# Patient Record
Sex: Female | Born: 1935 | Race: Black or African American | Hispanic: No | Smoking: Current some day smoker
Health system: Southern US, Community
[De-identification: ages and names within clinical notes are randomized; demographics above are authoritative.]

## PROBLEM LIST (undated history)

## (undated) DIAGNOSIS — F329 Major depressive disorder, single episode, unspecified: Secondary | ICD-10-CM

## (undated) DIAGNOSIS — N189 Chronic kidney disease, unspecified: Secondary | ICD-10-CM

## (undated) DIAGNOSIS — K5790 Diverticulosis of intestine, part unspecified, without perforation or abscess without bleeding: Secondary | ICD-10-CM

## (undated) DIAGNOSIS — I209 Angina pectoris, unspecified: Secondary | ICD-10-CM

## (undated) DIAGNOSIS — F32A Depression, unspecified: Secondary | ICD-10-CM

## (undated) DIAGNOSIS — M1990 Unspecified osteoarthritis, unspecified site: Secondary | ICD-10-CM

## (undated) DIAGNOSIS — J449 Chronic obstructive pulmonary disease, unspecified: Secondary | ICD-10-CM

## (undated) DIAGNOSIS — I739 Peripheral vascular disease, unspecified: Secondary | ICD-10-CM

## (undated) DIAGNOSIS — N200 Calculus of kidney: Secondary | ICD-10-CM

## (undated) DIAGNOSIS — I10 Essential (primary) hypertension: Secondary | ICD-10-CM

## (undated) DIAGNOSIS — I4891 Unspecified atrial fibrillation: Secondary | ICD-10-CM

## (undated) DIAGNOSIS — M109 Gout, unspecified: Secondary | ICD-10-CM

## (undated) DIAGNOSIS — E785 Hyperlipidemia, unspecified: Secondary | ICD-10-CM

## (undated) DIAGNOSIS — C801 Malignant (primary) neoplasm, unspecified: Secondary | ICD-10-CM

## (undated) DIAGNOSIS — I2510 Atherosclerotic heart disease of native coronary artery without angina pectoris: Secondary | ICD-10-CM

## (undated) DIAGNOSIS — I639 Cerebral infarction, unspecified: Secondary | ICD-10-CM

## (undated) DIAGNOSIS — I219 Acute myocardial infarction, unspecified: Secondary | ICD-10-CM

## (undated) DIAGNOSIS — H9190 Unspecified hearing loss, unspecified ear: Secondary | ICD-10-CM

## (undated) HISTORY — PX: BREAST SURGERY: SHX581

## (undated) HISTORY — PX: KIDNEY STONE SURGERY: SHX686

## (undated) HISTORY — PX: APPENDECTOMY: SHX54

## (undated) HISTORY — PX: CORONARY ARTERY BYPASS GRAFT: SHX141

## (undated) HISTORY — PX: MASTECTOMY: SHX3

## (undated) HISTORY — PX: CARDIAC SURGERY: SHX584

---

## 2003-03-12 ENCOUNTER — Other Ambulatory Visit: Payer: Self-pay

## 2007-06-29 ENCOUNTER — Ambulatory Visit: Payer: Self-pay | Admitting: Unknown Physician Specialty

## 2009-06-15 ENCOUNTER — Inpatient Hospital Stay: Payer: Self-pay | Admitting: Internal Medicine

## 2010-02-12 ENCOUNTER — Inpatient Hospital Stay: Payer: Self-pay | Admitting: Internal Medicine

## 2010-08-06 ENCOUNTER — Inpatient Hospital Stay: Payer: Self-pay | Admitting: Internal Medicine

## 2010-09-24 ENCOUNTER — Inpatient Hospital Stay: Payer: Self-pay | Admitting: Internal Medicine

## 2011-06-22 ENCOUNTER — Inpatient Hospital Stay: Payer: Self-pay | Admitting: Student

## 2011-06-22 LAB — URINALYSIS, COMPLETE
Bilirubin,UR: NEGATIVE
Blood: NEGATIVE
Glucose,UR: NEGATIVE mg/dL (ref 0–75)
Nitrite: NEGATIVE
Ph: 5 (ref 4.5–8.0)
Protein: 100
Specific Gravity: 1.015 (ref 1.003–1.030)
Squamous Epithelial: 12

## 2011-06-22 LAB — COMPREHENSIVE METABOLIC PANEL
Albumin: 3.5 g/dL (ref 3.4–5.0)
Alkaline Phosphatase: 74 U/L (ref 50–136)
BUN: 34 mg/dL — ABNORMAL HIGH (ref 7–18)
Bilirubin,Total: 0.2 mg/dL (ref 0.2–1.0)
Co2: 23 mmol/L (ref 21–32)
Creatinine: 2.14 mg/dL — ABNORMAL HIGH (ref 0.60–1.30)
EGFR (Non-African Amer.): 24 — ABNORMAL LOW
Glucose: 151 mg/dL — ABNORMAL HIGH (ref 65–99)
Osmolality: 292 (ref 275–301)
SGPT (ALT): 26 U/L
Sodium: 141 mmol/L (ref 136–145)
Total Protein: 7.2 g/dL (ref 6.4–8.2)

## 2011-06-22 LAB — CBC
HGB: 8 g/dL — ABNORMAL LOW (ref 12.0–16.0)
MCHC: 33.8 g/dL (ref 32.0–36.0)
Platelet: 221 10*3/uL (ref 150–440)
RDW: 15.9 % — ABNORMAL HIGH (ref 11.5–14.5)
WBC: 10.4 10*3/uL (ref 3.6–11.0)

## 2011-06-23 LAB — PROTIME-INR
INR: 0.9
Prothrombin Time: 12.8 secs (ref 11.5–14.7)

## 2011-06-23 LAB — CBC WITH DIFFERENTIAL/PLATELET
Basophil #: 0 10*3/uL (ref 0.0–0.1)
Basophil #: 0 10*3/uL (ref 0.0–0.1)
Basophil %: 0.4 %
Eosinophil #: 0.1 10*3/uL (ref 0.0–0.7)
Eosinophil #: 0.1 10*3/uL (ref 0.0–0.7)
Eosinophil %: 1 %
HCT: 20.2 % — ABNORMAL LOW (ref 35.0–47.0)
HCT: 27.5 % — ABNORMAL LOW (ref 35.0–47.0)
Lymphocyte #: 2.1 10*3/uL (ref 1.0–3.6)
Lymphocyte #: 1.6 10*3/uL (ref 1.0–3.6)
Lymphocyte %: 21.9 %
MCH: 31.7 pg (ref 26.0–34.0)
MCH: 31.5 pg (ref 26.0–34.0)
MCHC: 34.3 g/dL (ref 32.0–36.0)
MCV: 95 fL (ref 80–100)
Monocyte #: 0.7 10*3/uL (ref 0.0–0.7)
Monocyte #: 0.6 10*3/uL (ref 0.0–0.7)
Monocyte %: 8.3 %
Monocyte %: 8.2 %
Neutrophil #: 5.7 10*3/uL (ref 1.4–6.5)
Neutrophil #: 4.9 10*3/uL (ref 1.4–6.5)
Platelet: 186 10*3/uL (ref 150–440)
Platelet: 175 10*3/uL (ref 150–440)
RBC: 2.13 10*6/uL — ABNORMAL LOW (ref 3.80–5.20)
RDW: 15.8 % — ABNORMAL HIGH (ref 11.5–14.5)
RDW: 17.2 % — ABNORMAL HIGH (ref 11.5–14.5)
WBC: 8.6 10*3/uL (ref 3.6–11.0)
WBC: 7.2 10*3/uL (ref 3.6–11.0)

## 2011-06-23 LAB — BASIC METABOLIC PANEL
Anion Gap: 10 (ref 7–16)
Calcium, Total: 8.1 mg/dL — ABNORMAL LOW (ref 8.5–10.1)
Chloride: 111 mmol/L — ABNORMAL HIGH (ref 98–107)
Co2: 22 mmol/L (ref 21–32)
EGFR (African American): 35 — ABNORMAL LOW
Osmolality: 290 (ref 275–301)
Potassium: 4.2 mmol/L (ref 3.5–5.1)
Sodium: 143 mmol/L (ref 136–145)

## 2011-06-24 LAB — BASIC METABOLIC PANEL
Anion Gap: 12 (ref 7–16)
BUN: 17 mg/dL (ref 7–18)
Calcium, Total: 8.5 mg/dL (ref 8.5–10.1)
Creatinine: 1.57 mg/dL — ABNORMAL HIGH (ref 0.60–1.30)
EGFR (African American): 41 — ABNORMAL LOW
Glucose: 89 mg/dL (ref 65–99)
Osmolality: 284 (ref 275–301)
Sodium: 142 mmol/L (ref 136–145)

## 2011-06-24 LAB — CBC WITH DIFFERENTIAL/PLATELET
Basophil %: 0.4 %
Eosinophil %: 1.7 %
HCT: 27.2 % — ABNORMAL LOW (ref 35.0–47.0)
HGB: 9.3 g/dL — ABNORMAL LOW (ref 12.0–16.0)
Lymphocyte #: 1.7 10*3/uL (ref 1.0–3.6)
MCH: 31.3 pg (ref 26.0–34.0)
MCV: 92 fL (ref 80–100)
Monocyte #: 0.6 10*3/uL (ref 0.0–0.7)
Neutrophil #: 4.6 10*3/uL (ref 1.4–6.5)
Platelet: 179 10*3/uL (ref 150–440)
RBC: 2.97 10*6/uL — ABNORMAL LOW (ref 3.80–5.20)
WBC: 7.1 10*3/uL (ref 3.6–11.0)

## 2011-06-25 LAB — CBC WITH DIFFERENTIAL/PLATELET
Basophil #: 0 10*3/uL (ref 0.0–0.1)
Basophil %: 0.3 %
Eosinophil #: 0.1 10*3/uL (ref 0.0–0.7)
HCT: 26.1 % — ABNORMAL LOW (ref 35.0–47.0)
HGB: 8.8 g/dL — ABNORMAL LOW (ref 12.0–16.0)
Lymphocyte #: 1.2 10*3/uL (ref 1.0–3.6)
Monocyte #: 0.6 10*3/uL (ref 0.0–0.7)
Neutrophil #: 4.6 10*3/uL (ref 1.4–6.5)
Platelet: 182 10*3/uL (ref 150–440)
RBC: 2.81 10*6/uL — ABNORMAL LOW (ref 3.80–5.20)
RDW: 17.2 % — ABNORMAL HIGH (ref 11.5–14.5)

## 2011-06-25 LAB — BASIC METABOLIC PANEL
BUN: 19 mg/dL — ABNORMAL HIGH (ref 7–18)
Calcium, Total: 8.7 mg/dL (ref 8.5–10.1)
Creatinine: 1.82 mg/dL — ABNORMAL HIGH (ref 0.60–1.30)
EGFR (African American): 35 — ABNORMAL LOW
EGFR (Non-African Amer.): 29 — ABNORMAL LOW
Potassium: 4 mmol/L (ref 3.5–5.1)

## 2013-01-30 ENCOUNTER — Other Ambulatory Visit: Payer: Self-pay | Admitting: Rheumatology

## 2013-02-03 LAB — WOUND AEROBIC CULTURE

## 2013-03-09 ENCOUNTER — Ambulatory Visit: Payer: Self-pay | Admitting: Emergency Medicine

## 2014-03-16 ENCOUNTER — Emergency Department: Payer: Self-pay | Admitting: Emergency Medicine

## 2014-03-16 LAB — CBC WITH DIFFERENTIAL/PLATELET
Basophil #: 0.1 10*3/uL (ref 0.0–0.1)
Basophil %: 0.6 %
EOS ABS: 0.1 10*3/uL (ref 0.0–0.7)
EOS PCT: 0.8 %
HCT: 37.2 % (ref 35.0–47.0)
HGB: 11.8 g/dL — AB (ref 12.0–16.0)
LYMPHS ABS: 1.6 10*3/uL (ref 1.0–3.6)
Lymphocyte %: 17.8 %
MCH: 31.3 pg (ref 26.0–34.0)
MCHC: 31.7 g/dL — AB (ref 32.0–36.0)
MCV: 99 fL (ref 80–100)
Monocyte #: 0.8 x10 3/mm (ref 0.2–0.9)
Monocyte %: 8.7 %
NEUTROS ABS: 6.4 10*3/uL (ref 1.4–6.5)
Neutrophil %: 72.1 %
PLATELETS: 290 10*3/uL (ref 150–440)
RBC: 3.77 10*6/uL — ABNORMAL LOW (ref 3.80–5.20)
RDW: 15.5 % — ABNORMAL HIGH (ref 11.5–14.5)
WBC: 8.8 10*3/uL (ref 3.6–11.0)

## 2014-03-16 LAB — COMPREHENSIVE METABOLIC PANEL
AST: 17 U/L (ref 15–37)
Albumin: 3.5 g/dL (ref 3.4–5.0)
Alkaline Phosphatase: 73 U/L
Anion Gap: 8 (ref 7–16)
BUN: 37 mg/dL — AB (ref 7–18)
Bilirubin,Total: 0.4 mg/dL (ref 0.2–1.0)
CREATININE: 2.41 mg/dL — AB (ref 0.60–1.30)
Calcium, Total: 8.7 mg/dL (ref 8.5–10.1)
Chloride: 105 mmol/L (ref 98–107)
Co2: 22 mmol/L (ref 21–32)
Glucose: 97 mg/dL (ref 65–99)
OSMOLALITY: 279 (ref 275–301)
Potassium: 4.5 mmol/L (ref 3.5–5.1)
SGPT (ALT): 22 U/L
Sodium: 135 mmol/L — ABNORMAL LOW (ref 136–145)
Total Protein: 7.6 g/dL (ref 6.4–8.2)

## 2014-03-16 LAB — LIPASE, BLOOD: Lipase: 242 U/L (ref 73–393)

## 2014-08-26 NOTE — Consult Note (Signed)
PATIENT NAME:  Kathryn Cain, Kathryn Cain MR#:  [id number] DATE OF BIRTH:  Mar 05, 1934  DATE OF CONSULTATION:  06/23/2011  REFERRING PHYSICIAN:  Max Sane, MD  CONSULTING PHYSICIAN:  Jill Side, MD  REASON FOR CONSULTATION: Hematochezia and anemia.   HISTORY OF PRESENT ILLNESS: This is a 81 year old female with history of diverticulosis and coronary artery disease. The patient recently restarted taking her aspirin 81 mg a day. Three days ago she went to the bathroom and had an episode of bright red blood per rectum. After that for the next 24 hours she had several bloody bowel movements. The last bloody bowel movement was about 48 hours ago. She went to her physician where her hemoglobin was noted to be 8. She came to the Emergency Room last night where hemoglobin was 8 and she was heme negative from below. This morning her hemoglobin has dropped to 6.9 and she is currently receiving packed RBC transfusion. Denies any further active bleeding. The patient has history of prior lower GI bleeding that was thought to be secondary to diverticulosis related to aspirin use. Denies any nausea, vomiting, hematemesis, or abdominal pain.   PAST MEDICAL HISTORY:  1. Hyperlipidemia. 2. Hypertension. 3. Coronary artery disease, status post CABG. 4. History of diverticulosis and diverticular bleeding. 5. Chronic obstructive pulmonary disease. 6. Chronic renal failure.  7. History of breast cancer.   PAST SURGICAL HISTORY:  1. Right-sided mastectomy.  2. Appendectomy.  3. Bowel surgery.   ALLERGIES: Morphine and Cipro. She is intolerant of aspirin.   FAMILY HISTORY: Positive for coronary artery disease.   SOCIAL HISTORY: She smokes about five cigarettes a day.   MEDICATIONS AT HOME:  1. Pravachol 20 mg a day. 2. Amiodarone 200 mg a day. 3. Gemfibrozil 600 mg b.i.d.  4. Nitroglycerin p.r.n.  5. Atenolol 25 mg a day. 6. Metoprolol 25 mg b.i.d.   REVIEW OF SYSTEMS: Quite unremarkable except for what  is mentioned in the history of present illness. She denies any chest pain, orthopnea, PND, or dyspnea on exertion.   PHYSICAL EXAMINATION:   GENERAL: Somewhat obese female. She does not appear to be in any acute distress. Quite awake and alert.   HEENT: Grossly unremarkable.   VITAL SIGNS: Temperature 98.6, pulse 78, respirations 18, blood pressure 134/78.   LUNGS: Grossly clear to auscultation.   CARDIOVASCULAR: Regular rate and rhythm. No gallops or murmur.   ABDOMEN: Slightly distended but soft. Bowel sounds positive. Nontender. No rebound or guarding was noted.   NEUROLOGIC: Appears to be unremarkable.   LABORATORY, DIAGNOSTIC, AND RADIOLOGICAL DATA: White cell count was 10.4 yesterday, is 8.6 today. Hemoglobin was 8 yesterday, 6.8 today. Platelet count 186. MCV normal at 95. Electrolytes unremarkable as well as liver enzymes. Creatinine 2.14. BUN 34. PT and INR are within normal limits.   ASSESSMENT AND PLAN: The patient is with hematochezia most likely secondary to diverticulosis. The patient has developed significant anemia with a hemoglobin of only 6.8. The patient has history of breast cancer. There is a possibility of chronic neoplasm, although the pattern of bleeding is more consistent with diverticular bleed. The patient had a colonoscopy about four years ago and no malignancy was found, although multiple polyps were removed. Agree with packed RBC transfusion and clear liquid diet. No need for Zosyn as there is no evidence of acute diverticulitis.   Plan has been discussed with the patient. Options were discussed including a repeat colonoscopy while she is in the hospital versus conservative management for suspected diverticular  bleed and colonoscopy as an outpatient at a later date. She has opted to undergo colonoscopy while she is in the hospital. I agree as this appears to be a good window of opportunity to perform a colonoscopy which would give [place] valuable information in case of  rebleeding as patient has already had at least one episode of diverticular bleed in the past. The procedure has been discussed with the patient in detail. She is in full agreement. We will proceed with a colonoscopy in a.m. Avoid aspirin in the future.   Further recommendations to follow.   ____________________________ Jill Side, MD si:drc D: 06/23/2011 11:19:38 ET T: 06/23/2011 11:34:12 ET JOB#: [id number]  cc: Jill Side, MD, <Dictator> Jill Side MD ELECTRONICALLY SIGNED 06/23/2011 12:06

## 2014-08-26 NOTE — H&P (Signed)
PATIENT NAME:  Kathryn Cain, Kathryn Cain MR#:  [id number] DATE OF BIRTH:  Aug 08, 1933  DATE OF ADMISSION:  06/22/2011  PRIMARY CARE PHYSICIAN: Dr. Jimmye Norman at [facility]   REQUESTING PHYSICIAN: Dr. Lisa Roca    CHIEF COMPLAINT: Abdominal pain and rectal bleed.   HISTORY OF PRESENT ILLNESS: The patient is a 81 year old female with a known history of diverticulitis, colitis, and coronary disease who is being admitted for anemia and possible lower GI bleed. The patient took a baby aspirin last Monday followed by Thursday as per request from Dr. Clayborn Bigness as per the patient when she saw him in January. The patient also ate some peanuts over the last week and started having blood which was noticeable for the last two days. She went to see her primary care physician who noticed her to be anemic with hemoglobin of 8 and requested her to come to the Emergency Department. While in the ED, her hemoglobin is still 8 and she is being admitted for further evaluation and management. Her Hemoccult stool was negative while in the ED as per the ED physician.   PAST MEDICAL HISTORY:  1. Hyperlipidemia.  2. Hypertension.  3. Coronary artery disease, status post CABG.  4. Diverticular bleed.  5. Colitis.  6. Chronic obstructive pulmonary disease.  7. 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  8. History of breast cancer.    PAST SURGICAL HISTORY:  1. Right-sided mastectomy.  2. Appendectomy.  3. Obstructive bowel surgery.   ALLERGIES: Morphine, aspirin, and Cipro.   FAMILY HISTORY: Positive for coronary disease.   SOCIAL HISTORY: She smokes 4 to 5 cigarettes for a long time. Denies any alcohol or drug use.   MEDICATIONS AT HOME BASED ON LAST DISCHARGE SUMMARY AND WHAT SHE COULD CONFIRM:  1. Pravachol 20 mg p.o. daily.  2. Amiodarone 200 mg p.o. daily. 3. Gemfibrozil 600 mg p.o. b.i.d.  4. Nitroglycerin 0.4 mg sublingual 1 tablet daily as needed.  5. Atenolol 25 mg p.o. daily.  6. Metoprolol 25 mg p.o. b.i.d.   REVIEW OF  SYSTEMS: CONSTITUTIONAL: No fever, fatigue, weakness. EYES: No blurred or double vision. ENT: No tinnitus or ear pain. RESPIRATORY: No cough, wheezing, hemoptysis. CARDIOVASCULAR: No chest pain, orthopnea, edema. GI: Positive for rectal bleed. No abdominal pain, nausea, or vomiting. GU: No dysuria or hematuria. ENDOCRINE: No polyuria or nocturia. HEMATOLOGY: Positive for anemia. No easy bruising. Positive for GI bleed. SKIN: No obvious rash, lesion, ulcer. NEUROLOGIC: No tingling, numbness, weakness. PSYCHIATRY: No history of anxiety or depression.   PHYSICAL EXAMINATION:   VITAL SIGNS: Temperature 98.4, heart rate 100, respirations 20 per minute, blood pressure 147/75 mmHg. She is saturating 99% on room air.   GENERAL: The patient is a 81 year old female lying in the bed comfortably without any acute distress.   EYES: Pupils equal, round, reactive to light and accommodation. No scleral icterus. Extraocular muscles intact.   HEENT: Head atraumatic, normocephalic. Oropharynx and nasopharynx clear.   NECK: Supple. No jugular venous distention. No thyroid enlargement or tenderness.   LUNGS: Clear to auscultation bilaterally. No wheezing, rales, rhonchi, or crepitation.   CARDIOVASCULAR: S1, S2 normal, tachycardic. No murmur, rubs, or gallop.   ABDOMEN: Soft, nontender, nondistended. Bowel sounds present. No organomegaly or mass.   EXTREMITIES: No pedal edema, cyanosis, clubbing.   NEUROLOGIC: Nonfocal examination. Cranial nerves III to XII intact. Muscle strength 5 out of 5 in all extremities. Sensation intact.   PSYCHIATRIC: The patient is oriented to time, place, and person x3.   SKIN:  No obvious rash, lesion, or ulcer.  LABORATORY, DIAGNOSTIC, AND RADIOLOGICAL DATA: Normal BMP except BUN of 34, creatinine 2.14, blood glucose 151. Normal liver function tests. Normal CBC except hemoglobin of 8, hematocrit 23.6. Urinalysis is negative except trace bacteria and 1 WBC.   IMPRESSION AND PLAN:   1. Anemia, likely acute on chronic blood loss, possibly diverticular in nature. Will start on IV Zosyn for the time being (per d/w dr Tiffany Kocher as he feels this could be due to underlying diverticulitis). Consult GI. Type and cross two units of packed red blood cells. She will likely need transfusion if she drops hemoglobin less than 8 considering her underlying coronary artery disease.  2. Possible GI bleed, likely diverticular in nature. Will consult GI. Start her on IV Protonix twice a day. She may need colonoscopy.  3. Acute on chronic kidney disease, stage II to III, with a baseline creatinine of 1.3 to 1.5. This is likely prerenal in nature. Will hydrate her and monitor her renal function. Avoid any nephrotoxins.  4. Hypertension. Will continue home medication.  5. Sinus tachycardia likely due to her not getting her medications this morning. Will start her on atenolol and metoprolol which should help her rate control.  6. Coronary artery disease, status post CABG. Will continue atenolol and metoprolol at this time. Doubt can use any antiplatelet agent as she does not tolerate. This is the second time she bled the same way after aspirin.   7. Tobacco abuse. She was counseled for about three minutes. She denies any need of nicotine replacement therapy at this time.      TOTAL TIME TAKING CARE OF THIS PATIENT: 55 minutes.   ____________________________ Kathryn Mellow. Manuella Ghazi, MD vss:drc D: 06/22/2011 20:43:39 ET T: 06/23/2011 06:24:51 ET JOB#: [id number]  cc: Kaegan Stigler S. Manuella Ghazi, MD, <Dictator> [place] MD ELECTRONICALLY SIGNED 06/23/2011 10:55

## 2014-08-26 NOTE — Consult Note (Signed)
Chief Complaint:   Subjective/Chief Complaint Colonoscopy prep. was poor with solid stools throughout the colon. Left sided diverticulosis without active bleeding. H and H stable.  Recommendations: Soft diet. Watch overnight. Follow H and H. Will follow.   Electronic Signatures: Jill Side (MD)  (Signed [phone] 12:45)  Authored: Chief Complaint   Last Updated: 20-Feb-13 12:45 by Jill Side (MD)

## 2014-08-26 NOTE — Consult Note (Signed)
Pt to have consult with Dr. Dionne Milo who is on call today. I talked to him.  Electronic Signatures: Manya Silvas (MD)  (Signed on 19-Feb-13 07:39)  Authored  Last Updated: 19-Feb-13 07:39 by Manya Silvas (MD)

## 2014-08-26 NOTE — Discharge Summary (Signed)
PATIENT NAME:  Kathryn Cain, Kathryn Cain MR#:  [id number] DATE OF BIRTH:  Mar 28, 1934  DATE OF ADMISSION:  06/22/2011 DATE OF DISCHARGE:  06/25/2011  CHIEF COMPLAINT: Abdominal pain and rectal bleeding.   CONSULTANT: Jill Side, MD - Gastroenterology.   DISCHARGE DIAGNOSES:  1. Acute on chronic anemia, likely from acute diverticular bleed. 2. Tachycardia.  SECONDARY DIAGNOSES:  1. Hyperlipidemia. 2. Hypertension. 3. Coronary artery disease status post coronary artery bypass graft.  4. History of diverticular bleed. 5. History of colitis. 6. Chronic kidney disease.  7. Chronic obstructive pulmonary disease.  8. History of breast cancer.   DISCHARGE MEDICATIONS:  1. Amiodarone 200 mg daily.  2. Gemfibrozil 600 mg twice a day. 3. Nitroglycerin 0.4 mg sublingual as needed. 4. Hydrocortisone suppository 25 mg per rectum twice a day. 5. Metoprolol tartrate 50 mg twice a day. 6. Pravastatin 80 mg daily.  7. Lasix 20 mg daily.  8. Lovaza 1000 mg 2 tabs twice a day. 9. Amlodipine 10 mg daily.   DIET: Low sodium, ADA, soft diet and after two weeks high fiber diet.   ACTIVITY: As tolerated.   DISCHARGE FOLLOWUP: Please follow-up with her primary care physician tomorrow with a BMP and full complete blood count check as discussed   HISTORY OF PRESENT ILLNESS: Please see the full history and physical dictated on 06/22/2011 by Dr. Manuella Ghazi, but briefly this is a 81 year old female with history of diverticulosis, diverticular bleed, colitis, and coronary artery disease status post CABG who presented in the setting of abdominal pain and rectal bleeding. The patient has been on aspirin at the request of her cardiologist. The patient also ate some peanuts.  She had some lower bleed and was noted to have anemia with hemoglobin of 8 and was admitted to the hospitalist service for further evaluation and management.   SIGNIFICANT LABS/IMAGING: Creatinine on arrival 2.14 and on discharge 1.82. Initial  hemoglobin 8 and lowest hemoglobin was on 06/23/2011 being 6.8 and on discharge 8.8. Hematocrit on arrival 23.6 and on discharge 26.1. Platelets 221. INR 0.9.   HOSPITAL COURSE:  1. Anemia: This was likely acute on chronic anemia in the setting of GI losses. Gastroenterology was consulted. She was initially started on a PPI. She has been on aspirin, however, she has a history of diverticulosis and diverticular bleed in the past. She was initially put on clears and per Gastroenterology underwent a colonoscopy. The patient did require 2 units of PRBC. The patient was initially admitted with Zosyn, however, Zosyn was stopped as I doubt that this was colitis as the patient had no fever or leukocytosis to suggest ongoing infectious process. This was also recommended per Gastroenterology. Unfortunately the colonoscopy had a bad prep and stool was seen throughout the colon. Besides diverticulosis, no acute bleed was found. The patient's blood count initially went up today and has trended down, however, the patient has not had any further bleeding and has no abdominal pain. The patient is adamant about going home. The risks of going home with down trend in blood count was discussed with the patient including ongoing bleed, perfuse bleeding, myocardial infarction, and even death. The patient states that she has some personal matters to take care of, however, she will go to her PCP tomorrow and check her blood counts and, if they are low or if she has ongoing bleed, she will come back to the ED. At this point, she has no symptoms of chest pain, shortness of breath, dizziness, palpitations, or abdominal pain. She will  be discharged with follow-up with her PCP tomorrow.  2. Acute on chronic renal failure: The patient does have stage II to III, with baseline creatinine to mid ones. On arrival it was slightly higher than her baseline. This was likely in the setting of GI bleed. She was given some IV fluids on arrival and then  did receive blood products. This has stabilized and it could be followed up as an outpatient.  3. Coronary artery disease, status post CABG: The patient has been on a beta blocker. There is some confusion about her beta blocker use. She had stated that she was on atenolol and metoprolol; however, this needs to be corroborated. That was started here, however. On discharge we will stop atenolol and discharge her on the metoprolol. She is also on aspirin, however, given the second bleed recently in the last year or so, this has to be discussed with the cardiologist and PCP in terms of the risk/benefit ratio. We will not discharge her with the aspirin. However, we would continue the beta blocker as the statin.  4. Hypertension: Outpatient medications could be resumed on an outpatient basis.  5. Sinus tachycardia: The patient was tachycardic on arrival and this is likely in the setting of GI losses. Also she did not take her blood pressure medications, including the beta blockers, on arrival. Her beta blockers were resumed and the patient currently is asymptomatic with resolution of the tachycardia. At this point, given that she has no ongoing bleed and her promise to follow up with her PCP tomorrow, she will be discharged.   CODE STATUS: THE PATIENT IS FULL CODE.  TOTAL TIME SPENT: 40 minutes.  ____________________________ Vivien Presto, MD sa:slb D: 06/25/2011 13:48:16 ET T: 06/25/2011 14:38:37 ET JOB#: [id number]  cc: Vivien Presto, MD, <Dictator> Myrle Sheng. Jimmye Norman, MD Jill Side, MD Karel Jarvis [facility] MD ELECTRONICALLY SIGNED 07/04/2011 8:56

## 2014-08-26 NOTE — Consult Note (Signed)
Brief Consult Note: Diagnosis: Lower GI bleed with significant anemia.   Patient was seen by consultant.   Consult note dictated.   Comments: Lower GI bleed with significant anemia, most likely diverticular. No signs of active bleeding. History of colon polyps, last colonoscopy was in 2009 and polyps were removed.  Recommendations: Transfuse PRBC. DC Zosyn as no evidence of diverticulitis. Avoid ASA in the future. Will proceed with acolonoscopy in am as she is due for a repeat colonoscopy and also due to significant blood loss anemia. Discussed with her and she is in full agreement.  Electronic Signatures: Jill Side (MD)  (Signed 19-Feb-13 11:14)  Authored: Brief Consult Note   Last Updated: 19-Feb-13 11:14 by Jill Side (MD)

## 2015-05-08 DIAGNOSIS — R0602 Shortness of breath: Secondary | ICD-10-CM | POA: Diagnosis not present

## 2015-05-08 DIAGNOSIS — I480 Paroxysmal atrial fibrillation: Secondary | ICD-10-CM | POA: Diagnosis not present

## 2015-05-25 DIAGNOSIS — M5441 Lumbago with sciatica, right side: Secondary | ICD-10-CM | POA: Diagnosis not present

## 2015-06-05 DIAGNOSIS — I480 Paroxysmal atrial fibrillation: Secondary | ICD-10-CM | POA: Diagnosis not present

## 2015-06-05 DIAGNOSIS — G4700 Insomnia, unspecified: Secondary | ICD-10-CM | POA: Diagnosis not present

## 2015-06-05 DIAGNOSIS — E785 Hyperlipidemia, unspecified: Secondary | ICD-10-CM | POA: Diagnosis not present

## 2015-06-05 DIAGNOSIS — N184 Chronic kidney disease, stage 4 (severe): Secondary | ICD-10-CM | POA: Diagnosis not present

## 2015-06-05 DIAGNOSIS — I701 Atherosclerosis of renal artery: Secondary | ICD-10-CM | POA: Diagnosis not present

## 2015-06-05 DIAGNOSIS — I158 Other secondary hypertension: Secondary | ICD-10-CM | POA: Diagnosis not present

## 2015-06-05 DIAGNOSIS — R011 Cardiac murmur, unspecified: Secondary | ICD-10-CM | POA: Diagnosis not present

## 2015-06-07 DIAGNOSIS — C50112 Malignant neoplasm of central portion of left female breast: Secondary | ICD-10-CM | POA: Diagnosis not present

## 2015-06-07 DIAGNOSIS — Z4432 Encounter for fitting and adjustment of external left breast prosthesis: Secondary | ICD-10-CM | POA: Diagnosis not present

## 2015-07-08 DIAGNOSIS — J441 Chronic obstructive pulmonary disease with (acute) exacerbation: Secondary | ICD-10-CM | POA: Diagnosis not present

## 2015-07-08 DIAGNOSIS — I10 Essential (primary) hypertension: Secondary | ICD-10-CM | POA: Diagnosis not present

## 2015-07-08 DIAGNOSIS — M1990 Unspecified osteoarthritis, unspecified site: Secondary | ICD-10-CM | POA: Diagnosis not present

## 2015-09-04 ENCOUNTER — Ambulatory Visit
Admission: EM | Admit: 2015-09-04 | Discharge: 2015-09-04 | Disposition: A | Discharge status: Home / Self Care | Payer: PPO | Attending: Family Medicine | Admitting: Family Medicine

## 2015-09-04 ENCOUNTER — Encounter: Payer: Self-pay | Admitting: Emergency Medicine

## 2015-09-04 DIAGNOSIS — Z8739 Personal history of other diseases of the musculoskeletal system and connective tissue: Secondary | ICD-10-CM | POA: Diagnosis not present

## 2015-09-04 DIAGNOSIS — M79644 Pain in right finger(s): Secondary | ICD-10-CM

## 2015-09-04 HISTORY — DX: Essential (primary) hypertension: I10

## 2015-09-04 HISTORY — DX: Gout, unspecified: M10.9

## 2015-09-04 MED ORDER — PREDNISONE 10 MG (21) PO TBPK: ORAL_TABLET | ORAL | Status: DC

## 2015-09-04 MED ORDER — PREDNISONE 10 MG (21) PO TBPK
ORAL_TABLET | ORAL | Status: DC
Start: 2015-09-04 — End: 2015-09-04

## 2015-09-04 NOTE — ED Provider Notes (Signed)
CSN: [id number]     Arrival date & time 09/04/15  1430 History   First MD Initiated Contact with Patient 09/04/15 1511       Nurses notes were reviewed. Chief Complaint  Patient presents with  . finger pain     Patient Comes in because of pain in the right middle finger. She is with her husband. Neither are the best historians. At one time there indicating that his gout and then she changed her story that there was an infection present and after the pain she soak the finger in some Pine-[place]. After questioning them further she's had a history of gout in that finger before. They assure me that gout does look like this when he hits her finger. The fissure they did his gout. Consent discussed with him that if this is an infection prednisone is not necessarily the drug of choice was wishes with the requesting. They have informed me that she is on allopurinol which goes along with her history of having gout. M.D. also relate to me that further questioning she does have some issues with her kidneys that cannot say what exactly is the problem with her kidneys. Apparently because of renal failure she does does not use anti-inflammatories or colchicine for her gout flare. Explained to her that if her finger is not better in 48 hours she needs to go to the ED has a hand surgeon that she may need to have that finger opened and evaluated further by specialist in hand surgery.  (Consider location/radiation/quality/duration/timing/severity/associated sxs/prior Treatment) Patient is a 82 y.o. female presenting with extremity pain. The history is provided by the patient and the spouse. The history is limited by the condition of the patient. No language interpreter was used.  Extremity Pain This is a recurrent problem. The current episode started more than 2 days ago. The problem occurs constantly. The problem has been gradually worsening. Pertinent negatives include no chest pain, no abdominal pain, no headaches and  no shortness of breath. Exacerbated by: Use a finger or pressure on the finger. Nothing relieves the symptoms. She has tried nothing for the symptoms. The treatment provided no relief.    Past Medical History  Diagnosis Date  . Hypertension   . Gout    Past Surgical History  Procedure Laterality Date  . Breast surgery    . Cardiac surgery    . Appendectomy     History reviewed. No pertinent family history. Social History  Substance Use Topics  . Smoking status: Current Some Day Smoker    Types: Cigarettes  . Smokeless tobacco: None  . Alcohol Use: No   OB History    No data available     Review of Systems  Unable to perform ROS: Other  Respiratory: Negative for shortness of breath.   Cardiovascular: Negative for chest pain.  Gastrointestinal: Negative for abdominal pain.  Neurological: Negative for headaches.    Allergies  Codeine  Home Medications   Prior to Admission medications   Medication Sig Start Date End Date Taking? Authorizing Provider  ALLOPURINOL PO Take 100 mg by mouth daily.    Yes Historical Provider, MD  amiodarone (PACERONE) 200 MG tablet Take 200 mg by mouth daily.   Yes Historical Provider, MD  amLODipine (NORVASC) 10 MG tablet Take 10 mg by mouth daily.   Yes Historical Provider, MD  aspirin 81 MG tablet Take 81 mg by mouth daily.   Yes Historical Provider, MD  furosemide (LASIX) 40 MG tablet Take  40 mg by mouth daily.   Yes Historical Provider, MD  metoprolol tartrate (LOPRESSOR) 25 MG tablet Take 50 mg by mouth 2 (two) times daily.    Yes Historical Provider, MD  pravastatin (PRAVACHOL) 20 MG tablet Take 80 mg by mouth daily.    Yes Historical Provider, MD  zolpidem (AMBIEN) 10 MG tablet Take 10 mg by mouth at bedtime as needed for sleep.   Yes Historical Provider, MD  predniSONE (STERAPRED UNI-PAK 21 TAB) 10 MG (21) TBPK tablet Sig 6 tablet day 1, 5 tablets day 2, 4 tablets day 3,,3tablets day 4, 2 tablets day 5, 1 tablet day 6 take all tablets  orally 09/04/15   Frederich Cha, MD   Meds Ordered and Administered this Visit  Medications - No data to display  BP 148/83 mmHg  Pulse 80  Temp(Src) 97 F (36.1 C) (Tympanic)  Resp 16  Ht 5\' 4"  (1.626 m)  Wt 160 lb (72.576 kg)  BMI 27.45 kg/m2  SpO2 100% No data found.   Physical Exam  Constitutional: She appears well-nourished.  Eyes: Pupils are equal, round, and reactive to light.  Neck: Normal range of motion.  Pulmonary/Chest: Effort normal.  Musculoskeletal:       Right hand: She exhibits tenderness and swelling.       Hands: Patient always had scarring on the right middle finger where she states is been opened before and 4 she's had recurring gout. There is marked tenderness and warmth over the area and swelling. Presentation to either be with a significant infection or recurrent gout exacerbation of that joint  Neurological: She is alert.  Skin: Skin is warm. Rash noted.  Psychiatric: She has a normal mood and affect.  Vitals reviewed.   ED Course  Procedures (including critical care time)  Labs Review Labs Reviewed - No data to display  Imaging Review No results found.   Visual Acuity Review  Right Eye Distance:   Left Eye Distance:   Bilateral Distance:    Right Eye Near:   Left Eye Near:    Bilateral Near:         MDM   1. Pain of right middle finger   2. Hx of acute gouty arthritis    Will undergo requesting place her on 60 course of prednisone. Was given warned that if things are not better within 48 hours strongly suggest going to a surgeon/hand surgeon for further evaluation and possible opening of that area. Due to history of kidney problems will hold off on place going to culture seen or anti-inflammatories.  Patient and husband eager to leave.  Note: This dictation was prepared with Dragon dictation along with smaller phrase technology. Any transcriptional errors that result from this process are unintentional.    Frederich Cha,  MD 09/04/15 1547

## 2015-09-04 NOTE — ED Notes (Signed)
Patient c/o pain, redness and swelling in her right 3rd finger for the past week.

## 2015-09-04 NOTE — Discharge Instructions (Signed)
Musculoskeletal Pain Musculoskeletal pain is muscle and boney aches and pains. These pains can occur in any part of the body. Your caregiver may treat you without knowing the cause of the pain. They may treat you if blood or urine tests, X-rays, and other tests were normal.  CAUSES There is often not a definite cause or reason for these pains. These pains may be caused by a type of germ (virus). The discomfort may also come from overuse. Overuse includes working out too hard when your body is not fit. Boney aches also come from weather changes. Bone is sensitive to atmospheric pressure changes. HOME CARE INSTRUCTIONS   Ask when your test results will be ready. Make sure you get your test results.  Only take over-the-counter or prescription medicines for pain, discomfort, or fever as directed by your caregiver. If you were given medications for your condition, do not drive, operate machinery or power tools, or sign legal documents for 24 hours. Do not drink alcohol. Do not take sleeping pills or other medications that may interfere with treatment.  Continue all activities unless the activities cause more pain. When the pain lessens, slowly resume normal activities. Gradually increase the intensity and duration of the activities or exercise.  During periods of severe pain, bed rest may be helpful. Lay or sit in any position that is comfortable.  Putting ice on the injured area.  Put ice in a bag.  Place a towel between your skin and the bag.  Leave the ice on for 15 to 20 minutes, 3 to 4 times a day.  Follow up with your caregiver for continued problems and no reason can be found for the pain. If the pain becomes worse or does not go away, it may be necessary to repeat tests or do additional testing. Your caregiver may need to look further for a possible cause. SEEK IMMEDIATE MEDICAL CARE IF:  You have pain that is getting worse and is not relieved by medications.  You develop chest pain  that is associated with shortness or breath, sweating, feeling sick to your stomach (nauseous), or throw up (vomit).  Your pain becomes localized to the abdomen.  You develop any new symptoms that seem different or that concern you. MAKE SURE YOU:   Understand these instructions.  Will watch your condition.  Will get help right away if you are not doing well or get worse.   This information is not intended to replace advice given to you by your health care provider. Make sure you discuss any questions you have with your health care provider.   Document Released: 04/20/2005 Document Revised: 07/13/2011 Document Reviewed: 12/23/2012 Elsevier Interactive Patient Education 2016 [place]. Gout Gout is an inflammatory arthritis caused by a buildup of uric acid crystals in the joints. Uric acid is a chemical that is normally present in the blood. When the level of uric acid in the blood is too high it can form crystals that deposit in your joints and tissues. This causes joint redness, soreness, and swelling (inflammation). Repeat attacks are common. Over time, uric acid crystals can form into masses (tophi) near a joint, destroying bone and causing disfigurement. Gout is treatable and often preventable. CAUSES  The disease begins with elevated levels of uric acid in the blood. Uric acid is produced by your body when it breaks down a naturally found substance called purines. Certain foods you eat, such as meats and fish, contain high amounts of purines. Causes of an elevated uric  acid level include:  Being passed down from parent to child (heredity).  Diseases that cause increased uric acid production (such as obesity, psoriasis, and certain cancers).  Excessive alcohol use.  Diet, especially diets rich in meat and seafood.  Medicines, including certain cancer-fighting medicines (chemotherapy), water pills (diuretics), and aspirin.  Chronic kidney disease. The kidneys are no longer able  to remove uric acid well.  Problems with metabolism. Conditions strongly associated with gout include:  Obesity.  High blood pressure.  High cholesterol.  Diabetes. Not everyone with elevated uric acid levels gets gout. It is not understood why some people get gout and others do not. Surgery, joint injury, and eating too much of certain foods are some of the factors that can lead to gout attacks. SYMPTOMS   An attack of gout comes on quickly. It causes intense pain with redness, swelling, and warmth in a joint.  Fever can occur.  Often, only one joint is involved. Certain joints are more commonly involved:  Base of the big toe.  Knee.  Ankle.  Wrist.  Finger. Without treatment, an attack usually goes away in a few days to weeks. Between attacks, you usually will not have symptoms, which is different from many other forms of arthritis. DIAGNOSIS  Your caregiver will suspect gout based on your symptoms and exam. In some cases, tests may be recommended. The tests may include:  Blood tests.  Urine tests.  X-rays.  Joint fluid exam. This exam requires a needle to remove fluid from the joint (arthrocentesis). Using a microscope, gout is confirmed when uric acid crystals are seen in the joint fluid. TREATMENT  There are two phases to gout treatment: treating the sudden onset (acute) attack and preventing attacks (prophylaxis).  Treatment of an Acute Attack.  Medicines are used. These include anti-inflammatory medicines or steroid medicines.  An injection of steroid medicine into the affected joint is sometimes necessary.  The painful joint is rested. Movement can worsen the arthritis.  You may use warm or cold treatments on painful joints, depending which works best for you.  Treatment to Prevent Attacks.  If you suffer from frequent gout attacks, your caregiver may advise preventive medicine. These medicines are started after the acute attack subsides. These  medicines either help your kidneys eliminate uric acid from your body or decrease your uric acid production. You may need to stay on these medicines for a very long time.  The early phase of treatment with preventive medicine can be associated with an increase in acute gout attacks. For this reason, during the first few months of treatment, your caregiver may also advise you to take medicines usually used for acute gout treatment. Be sure you understand your caregiver's directions. Your caregiver may make several adjustments to your medicine dose before these medicines are effective.  Discuss dietary treatment with your caregiver or dietitian. Alcohol and drinks high in sugar and fructose and foods such as meat, poultry, and seafood can increase uric acid levels. Your caregiver or dietitian can advise you on drinks and foods that should be limited. HOME CARE INSTRUCTIONS   Do not take aspirin to relieve pain. This raises uric acid levels.  Only take over-the-counter or prescription medicines for pain, discomfort, or fever as directed by your caregiver.  Rest the joint as much as possible. When in bed, keep sheets and blankets off painful areas.  Keep the affected joint raised (elevated).  Apply warm or cold treatments to painful joints. Use of warm or cold  treatments depends on which works best for you.  Use crutches if the painful joint is in your leg.  Drink enough fluids to keep your urine clear or pale yellow. This helps your body get rid of uric acid. Limit alcohol, sugary drinks, and fructose drinks.  Follow your dietary instructions. Pay careful attention to the amount of protein you eat. Your daily diet should emphasize fruits, vegetables, whole grains, and fat-free or low-fat milk products. Discuss the use of coffee, vitamin C, and cherries with your caregiver or dietitian. These may be helpful in lowering uric acid levels.  Maintain a healthy body weight. SEEK MEDICAL CARE IF:    You develop diarrhea, vomiting, or any side effects from medicines.  You do not feel better in 24 hours, or you are getting worse. SEEK IMMEDIATE MEDICAL CARE IF:   Your joint becomes suddenly more tender, and you have chills or a fever. MAKE SURE YOU:   Understand these instructions.  Will watch your condition.  Will get help right away if you are not doing well or get worse.   This information is not intended to replace advice given to you by your health care provider. Make sure you discuss any questions you have with your health care provider.   Document Released: 04/17/2000 Document Revised: 05/11/2014 Document Reviewed: 12/02/2011 Elsevier Interactive Patient Education [employer].

## 2015-09-25 ENCOUNTER — Ambulatory Visit
Admission: EM | Admit: 2015-09-25 | Discharge: 2015-09-25 | Disposition: A | Discharge status: Home / Self Care | Payer: PPO | Attending: Family Medicine | Admitting: Family Medicine

## 2015-09-25 ENCOUNTER — Encounter: Payer: Self-pay | Admitting: Emergency Medicine

## 2015-09-25 DIAGNOSIS — M10032 Idiopathic gout, left wrist: Secondary | ICD-10-CM | POA: Diagnosis not present

## 2015-09-25 HISTORY — DX: Unspecified osteoarthritis, unspecified site: M19.90

## 2015-09-25 MED ORDER — PREDNISONE 10 MG (21) PO TBPK: ORAL_TABLET | ORAL | Status: DC

## 2015-09-25 NOTE — ED Provider Notes (Signed)
CSN: [id number]     Arrival date & time 09/25/15  1421 History   First MD Initiated Contact with Patient 09/25/15 1444    Nurses notes were reviewed. Chief Complaint  Patient presents with  . Hand Pain   Patient was seen by me earlier this month for pain in the right fingers but she felt was secondary to gout. That improved greatly with prednisone now she has left wrist pain she also feels his gout. She call first call and we'll prednisone which I refused to do. Discussed with her that she should this is gout she will should follow-up with rheumatologist or PCP. She has recently changed to [facility] because her previous doctors at the [facility] did not take her insurance. She sees Dr. Jefm Bryant and I strongly suggest she follows him. The other concern that I have is that she does want have x-rays of the hand she is told me that her kidneys and not good the last blood work that I see in the common system was back in 2015 and her Creatinine was 2.43. I've explained to her that no she wants pain medication but I would have to get blood work on her to give her pain medication and she does not want to wait to have blood work drawn here.   The pain in the left hand started several days ago and progressively gotten worse.  History hypertension renal failure arthritis breast surgery cardiac surgery and appendectomy she is allergic to codeine and still smokes       (Consider location/radiation/quality/duration/timing/severity/associated sxs/prior Treatment) Patient is a 82 y.o. female presenting with hand pain. The history is provided by the patient and a friend. No language interpreter was used.  Hand Pain This is a new problem. The current episode started more than 2 days ago. The problem occurs constantly. The problem has been gradually worsening. Pertinent negatives include no chest pain, no abdominal pain, no headaches and no shortness of breath. Exacerbated by: Using the left hand.  Nothing relieves the symptoms. She has tried nothing for the symptoms.    Past Medical History  Diagnosis Date  . Hypertension   . Gout   . Arthritis    Past Surgical History  Procedure Laterality Date  . Breast surgery    . Cardiac surgery    . Appendectomy     History reviewed. No pertinent family history. Social History  Substance Use Topics  . Smoking status: Current Some Day Smoker    Types: Cigarettes  . Smokeless tobacco: None  . Alcohol Use: No   OB History    No data available     Review of Systems  Respiratory: Negative for shortness of breath.   Cardiovascular: Negative for chest pain.  Gastrointestinal: Negative for abdominal pain.  Neurological: Negative for headaches.  All other systems reviewed and are negative.   Allergies  Codeine  Home Medications   Prior to Admission medications   Medication Sig Start Date End Date Taking? Authorizing Provider  ALLOPURINOL PO Take 100 mg by mouth daily.     Historical Provider, MD  amiodarone (PACERONE) 200 MG tablet Take 200 mg by mouth daily.    Historical Provider, MD  amLODipine (NORVASC) 10 MG tablet Take 10 mg by mouth daily.    Historical Provider, MD  aspirin 81 MG tablet Take 81 mg by mouth daily.    Historical Provider, MD  furosemide (LASIX) 40 MG tablet Take 40 mg by mouth daily.  Historical Provider, MD  metoprolol tartrate (LOPRESSOR) 25 MG tablet Take 50 mg by mouth 2 (two) times daily.     Historical Provider, MD  pravastatin (PRAVACHOL) 20 MG tablet Take 80 mg by mouth daily.     Historical Provider, MD  predniSONE (STERAPRED UNI-PAK 21 TAB) 10 MG (21) TBPK tablet Sig 6 tablet day 1, 5 tablets day 2, 4 tablets day 3,,3tablets day 4, 2 tablets day 5, 1 tablet day 6 take all tablets orally 09/25/15   Frederich Cha, MD  zolpidem (AMBIEN) 10 MG tablet Take 10 mg by mouth at bedtime as needed for sleep.    Historical Provider, MD   Meds Ordered and Administered this Visit  Medications - No data to  display  BP 140/90 mmHg  Pulse 78  Temp(Src) 98.2 F (36.8 C) (Tympanic)  Resp 16  Ht 5\' 4"  (1.626 m)  Wt 160 lb (72.576 kg)  BMI 27.45 kg/m2  SpO2 98% No data found.   Physical Exam  Constitutional: She is oriented to person, place, and time. She appears well-developed and well-nourished.  HENT:  Head: Normocephalic.  Eyes: Pupils are equal, round, and reactive to light.  Neck: Normal range of motion.  Musculoskeletal: She exhibits tenderness.       Hands: Tenderness over the left wrist  Neurological: She is alert and oriented to person, place, and time.  Skin: Skin is warm. No rash noted. No erythema.  Psychiatric: She has a normal mood and affect.  Vitals reviewed.   ED Course  Procedures (including critical care time)  Labs Review Labs Reviewed - No data to display  Imaging Review No results found.   Visual Acuity Review  Right Eye Distance:   Left Eye Distance:   Bilateral Distance:    Right Eye Near:   Left Eye Near:    Bilateral Near:         MDM   1. Acute idiopathic gout of left wrist    We'll offer her another round of prednisone she takes. Please see above as far as my stand on pain medication. Strongly recommend she follow-up with her rheumatologist Dr. Cristi Loron. Once again informed her the roles of the urgent care and all restriction as far as refills on medications.  Note: This dictation was prepared with Dragon dictation along with smaller phrase technology. Any transcriptional errors that result from this process are unintentional.    Frederich Cha, MD 09/25/15 [phone]

## 2015-09-25 NOTE — ED Notes (Signed)
Patient c/o pain in her left hand for the past 4 weeks.  Patient denies fall or injury.  Patient reports history of arthritis.

## 2015-09-25 NOTE — Discharge Instructions (Signed)
Gout °Gout is when your joints become red, sore, and swell (inflamed). This is caused by the buildup of uric acid crystals in the joints. Uric acid is a chemical that is normally in the blood. If the level of uric acid gets too high in the blood, these crystals form in your joints and tissues. Over time, these crystals can form into masses near the joints and tissues. These masses can destroy bone and cause the bone to look misshapen (deformed). °HOME CARE  °· Do not take aspirin for pain. °· Only take medicine as told by your doctor. °· Rest the joint as much as you can. When in bed, keep sheets and blankets off painful areas. °· Keep the sore joints raised (elevated). °· Put warm or cold packs on painful joints. Use of warm or cold packs depends on which works best for you. °· Use crutches if the painful joint is in your leg. °· Drink enough fluids to keep your pee (urine) clear or pale yellow. Limit alcohol, sugary drinks, and drinks with fructose in them. °· Follow your diet instructions. Pay careful attention to how much protein you eat. Include fruits, vegetables, whole grains, and fat-free or low-fat milk products in your daily diet. Talk to your doctor or dietitian about the use of coffee, vitamin C, and cherries. These may help lower uric acid levels. °· Keep a healthy body weight. °GET HELP RIGHT AWAY IF:  °· You have watery poop (diarrhea), throw up (vomit), or have any side effects from medicines. °· You do not feel better in 24 hours, or you are getting worse. °· Your joint becomes suddenly more tender, and you have chills or a fever. °MAKE SURE YOU:  °· Understand these instructions. °· Will watch your condition. °· Will get help right away if you are not doing well or get worse. °  °This information is not intended to replace advice given to you by your health care provider. Make sure you discuss any questions you have with your health care provider. °  °Document Released: 01/28/2008 Document Revised:  05/11/2014 Document Reviewed: 12/02/2011 °Elsevier Interactive Patient Education ©2016 Elsevier Inc. ° °

## 2015-10-22 DIAGNOSIS — M79644 Pain in right finger(s): Secondary | ICD-10-CM | POA: Diagnosis not present

## 2015-10-22 DIAGNOSIS — M12532 Traumatic arthropathy, left wrist: Secondary | ICD-10-CM | POA: Diagnosis not present

## 2015-10-22 DIAGNOSIS — R600 Localized edema: Secondary | ICD-10-CM | POA: Diagnosis not present

## 2015-10-22 DIAGNOSIS — I10 Essential (primary) hypertension: Secondary | ICD-10-CM | POA: Diagnosis not present

## 2015-10-22 DIAGNOSIS — J0100 Acute maxillary sinusitis, unspecified: Secondary | ICD-10-CM | POA: Diagnosis not present

## 2015-10-22 DIAGNOSIS — M12531 Traumatic arthropathy, right wrist: Secondary | ICD-10-CM | POA: Diagnosis not present

## 2015-10-22 DIAGNOSIS — M869 Osteomyelitis, unspecified: Secondary | ICD-10-CM | POA: Diagnosis not present

## 2015-10-22 DIAGNOSIS — M12539 Traumatic arthropathy, unspecified wrist: Secondary | ICD-10-CM | POA: Diagnosis not present

## 2015-10-28 DIAGNOSIS — M1A9XX1 Chronic gout, unspecified, with tophus (tophi): Secondary | ICD-10-CM | POA: Diagnosis not present

## 2015-10-28 DIAGNOSIS — M79641 Pain in right hand: Secondary | ICD-10-CM | POA: Diagnosis not present

## 2015-10-28 DIAGNOSIS — N183 Chronic kidney disease, stage 3 (moderate): Secondary | ICD-10-CM | POA: Diagnosis not present

## 2015-11-15 DIAGNOSIS — M898X9 Other specified disorders of bone, unspecified site: Secondary | ICD-10-CM | POA: Diagnosis not present

## 2015-11-15 DIAGNOSIS — M109 Gout, unspecified: Secondary | ICD-10-CM | POA: Diagnosis not present

## 2015-11-21 ENCOUNTER — Encounter
Admission: RE | Admit: 2015-11-21 | Discharge: 2015-11-21 | Disposition: A | Discharge status: Home / Self Care | Payer: PPO | Source: Ambulatory Visit | Attending: Orthopedic Surgery | Admitting: Orthopedic Surgery

## 2015-11-21 DIAGNOSIS — I252 Old myocardial infarction: Secondary | ICD-10-CM | POA: Diagnosis not present

## 2015-11-21 DIAGNOSIS — Z01810 Encounter for preprocedural cardiovascular examination: Secondary | ICD-10-CM | POA: Diagnosis not present

## 2015-11-21 DIAGNOSIS — I10 Essential (primary) hypertension: Secondary | ICD-10-CM | POA: Diagnosis not present

## 2015-11-21 DIAGNOSIS — I2510 Atherosclerotic heart disease of native coronary artery without angina pectoris: Secondary | ICD-10-CM | POA: Diagnosis not present

## 2015-11-21 HISTORY — DX: Chronic kidney disease, unspecified: N18.9

## 2015-11-21 HISTORY — DX: Hyperlipidemia, unspecified: E78.5

## 2015-11-21 HISTORY — DX: Angina pectoris, unspecified: I20.9

## 2015-11-21 HISTORY — DX: Atherosclerotic heart disease of native coronary artery without angina pectoris: I25.10

## 2015-11-21 HISTORY — DX: Acute myocardial infarction, unspecified: I21.9

## 2015-11-21 HISTORY — DX: Diverticulosis of intestine, part unspecified, without perforation or abscess without bleeding: K57.90

## 2015-11-21 HISTORY — DX: Unspecified atrial fibrillation: I48.91

## 2015-11-21 HISTORY — DX: Peripheral vascular disease, unspecified: I73.9

## 2015-11-21 HISTORY — DX: Chronic obstructive pulmonary disease, unspecified: J44.9

## 2015-11-21 HISTORY — DX: Cerebral infarction, unspecified: I63.9

## 2015-11-21 HISTORY — DX: Calculus of kidney: N20.0

## 2015-11-21 NOTE — Patient Instructions (Signed)
Your procedure is scheduled on: Thursday 11/28/15 Report to Day Surgery. 2ND FLOOR MEDICAL MALL ENTRANCE To find out your arrival time please call [phone] between 1PM - 3PM on Wednesday 11/27/15.  Remember: Instructions that are not followed completely may result in serious medical risk, up to and including death, or upon the discretion of your surgeon and anesthesiologist your surgery may need to be rescheduled.    __X__ 1. Do not eat food or drink liquids after midnight. No gum chewing or hard candies.     __X__ 2. No Alcohol for 24 hours before or after surgery.   ____ 3. Bring all medications with you on the day of surgery if instructed.    __X__ 4. Notify your doctor if there is any change in your medical condition     (cold, fever, infections).     Do not wear jewelry, make-up, hairpins, clips or nail polish.  Do not wear lotions, powders, or perfumes.   Do not shave 48 hours prior to surgery. Men may shave face and neck.  Do not bring valuables to the hospital.    [facility] is not responsible for any belongings or valuables.               Contacts, dentures or bridgework may not be worn into surgery.  Leave your suitcase in the car. After surgery it may be brought to your room.  For patients admitted to the hospital, discharge time is determined by your                treatment team.   Patients discharged the day of surgery will not be allowed to drive home.   Please read over the following fact sheets that you were given:   Surgical Site Infection Prevention   __X__ Take these medicines the morning of surgery with A SIP OF WATER:    1. AMIODARONE  2. AMLODIPINE  3. METOPROLOL  4.  5.  6.  ____ Fleet Enema (as directed)   __X__ Use CHG Soap as directed  ____ Use inhalers on the day of surgery  ____ Stop metformin 2 days prior to surgery    ____ Take 1/2 of usual insulin dose the night before surgery and none on the morning of surgery.   __X__ Stop  Coumadin/Plavix/aspirin on CONTACT CARDIOLOGIST (HEART DOCTOR) ABOUT WHEN IT IS SAFE TO STOP ASPIRIN  ____ Stop Anti-inflammatories on    __X__ Stop supplements until after surgery.  FISH OIL AND VITAMIN E  ____ Bring C-Pap to the hospital.

## 2015-11-28 ENCOUNTER — Ambulatory Visit
Admission: RE | Admit: 2015-11-28 | Discharge: 2015-11-28 | Disposition: A | Discharge status: Home / Self Care | Payer: PPO | Source: Ambulatory Visit | Attending: Orthopedic Surgery | Admitting: Orthopedic Surgery

## 2015-11-28 ENCOUNTER — Encounter
Admission: RE | Disposition: A | Discharge status: Home / Self Care | Payer: Self-pay | Source: Ambulatory Visit | Attending: Orthopedic Surgery

## 2015-11-28 ENCOUNTER — Ambulatory Visit: Payer: PPO | Admitting: Anesthesiology

## 2015-11-28 ENCOUNTER — Encounter: Payer: Self-pay | Admitting: *Deleted

## 2015-11-28 DIAGNOSIS — J449 Chronic obstructive pulmonary disease, unspecified: Secondary | ICD-10-CM | POA: Diagnosis not present

## 2015-11-28 DIAGNOSIS — M009 Pyogenic arthritis, unspecified: Secondary | ICD-10-CM | POA: Diagnosis not present

## 2015-11-28 DIAGNOSIS — Z888 Allergy status to other drugs, medicaments and biological substances status: Secondary | ICD-10-CM | POA: Insufficient documentation

## 2015-11-28 DIAGNOSIS — M2460 Ankylosis, unspecified joint: Secondary | ICD-10-CM | POA: Diagnosis not present

## 2015-11-28 DIAGNOSIS — I25119 Atherosclerotic heart disease of native coronary artery with unspecified angina pectoris: Secondary | ICD-10-CM | POA: Diagnosis not present

## 2015-11-28 DIAGNOSIS — I4891 Unspecified atrial fibrillation: Secondary | ICD-10-CM | POA: Insufficient documentation

## 2015-11-28 DIAGNOSIS — I252 Old myocardial infarction: Secondary | ICD-10-CM | POA: Diagnosis not present

## 2015-11-28 DIAGNOSIS — Z951 Presence of aortocoronary bypass graft: Secondary | ICD-10-CM | POA: Diagnosis not present

## 2015-11-28 DIAGNOSIS — M109 Gout, unspecified: Secondary | ICD-10-CM | POA: Diagnosis not present

## 2015-11-28 DIAGNOSIS — Z885 Allergy status to narcotic agent status: Secondary | ICD-10-CM | POA: Insufficient documentation

## 2015-11-28 DIAGNOSIS — M898X9 Other specified disorders of bone, unspecified site: Secondary | ICD-10-CM | POA: Diagnosis not present

## 2015-11-28 DIAGNOSIS — M898X4 Other specified disorders of bone, hand: Secondary | ICD-10-CM | POA: Diagnosis not present

## 2015-11-28 DIAGNOSIS — Z8673 Personal history of transient ischemic attack (TIA), and cerebral infarction without residual deficits: Secondary | ICD-10-CM | POA: Diagnosis not present

## 2015-11-28 DIAGNOSIS — I25810 Atherosclerosis of coronary artery bypass graft(s) without angina pectoris: Secondary | ICD-10-CM | POA: Diagnosis not present

## 2015-11-28 DIAGNOSIS — Z87442 Personal history of urinary calculi: Secondary | ICD-10-CM | POA: Diagnosis not present

## 2015-11-28 DIAGNOSIS — I129 Hypertensive chronic kidney disease with stage 1 through stage 4 chronic kidney disease, or unspecified chronic kidney disease: Secondary | ICD-10-CM | POA: Diagnosis not present

## 2015-11-28 DIAGNOSIS — E785 Hyperlipidemia, unspecified: Secondary | ICD-10-CM | POA: Diagnosis not present

## 2015-11-28 DIAGNOSIS — K5790 Diverticulosis of intestine, part unspecified, without perforation or abscess without bleeding: Secondary | ICD-10-CM | POA: Diagnosis not present

## 2015-11-28 DIAGNOSIS — N189 Chronic kidney disease, unspecified: Secondary | ICD-10-CM | POA: Insufficient documentation

## 2015-11-28 DIAGNOSIS — Z7982 Long term (current) use of aspirin: Secondary | ICD-10-CM | POA: Diagnosis not present

## 2015-11-28 DIAGNOSIS — I739 Peripheral vascular disease, unspecified: Secondary | ICD-10-CM | POA: Insufficient documentation

## 2015-11-28 DIAGNOSIS — Z79899 Other long term (current) drug therapy: Secondary | ICD-10-CM | POA: Diagnosis not present

## 2015-11-28 HISTORY — PX: DISTAL INTERPHALANGEAL JOINT FUSION: SHX6428

## 2015-11-28 SURGERY — DISTAL INTERPHALANGEAL JOINT FUSION
Anesthesia: General | Site: Finger | Laterality: Right | Wound class: Clean

## 2015-11-28 MED ORDER — LIDOCAINE HCL (CARDIAC) 20 MG/ML IV SOLN
INTRAVENOUS | Status: DC | PRN
  Administered 2015-11-28: 100 mg via INTRAVENOUS

## 2015-11-28 MED ORDER — FAMOTIDINE 20 MG PO TABS
ORAL_TABLET | ORAL | Status: AC
  Administered 2015-11-28: 20 mg via ORAL
  Filled 2015-11-28: qty 1

## 2015-11-28 MED ORDER — SUCCINYLCHOLINE CHLORIDE 20 MG/ML IJ SOLN
INTRAMUSCULAR | Status: DC | PRN
  Administered 2015-11-28: 100 mg via INTRAVENOUS

## 2015-11-28 MED ORDER — LACTATED RINGERS IV SOLN
INTRAVENOUS | Status: DC
  Administered 2015-11-28: 15:00:00 via INTRAVENOUS

## 2015-11-28 MED ORDER — NEOSTIGMINE METHYLSULFATE 10 MG/10ML IV SOLN
INTRAVENOUS | Status: DC | PRN
Start: 2015-11-28 — End: 2015-11-28
  Administered 2015-11-28: 3 mg via INTRAVENOUS

## 2015-11-28 MED ORDER — PROPOFOL 10 MG/ML IV BOLUS
INTRAVENOUS | Status: DC | PRN
  Administered 2015-11-28: 120 mg via INTRAVENOUS

## 2015-11-28 MED ORDER — CEFAZOLIN SODIUM-DEXTROSE 2-4 GM/100ML-% IV SOLN
2.0000 g | Freq: Once | INTRAVENOUS | Status: AC
  Administered 2015-11-28: 2 g via INTRAVENOUS

## 2015-11-28 MED ORDER — KETAMINE HCL 50 MG/ML IJ SOLN
INTRAMUSCULAR | Status: DC | PRN
  Administered 2015-11-28: 20 mg via INTRAMUSCULAR

## 2015-11-28 MED ORDER — CEPHALEXIN 500 MG PO CAPS: 500.0000 mg | ORAL_CAPSULE | Freq: Four times a day (QID) | ORAL | Status: AC

## 2015-11-28 MED ORDER — FENTANYL CITRATE (PF) 100 MCG/2ML IJ SOLN: 25.0000 ug | INTRAMUSCULAR | Status: DC | PRN

## 2015-11-28 MED ORDER — BUPIVACAINE HCL (PF) 0.5 % IJ SOLN
INTRAMUSCULAR | Status: DC | PRN
  Administered 2015-11-28: 20 mL

## 2015-11-28 MED ORDER — GLYCOPYRROLATE 0.2 MG/ML IJ SOLN
INTRAMUSCULAR | Status: DC | PRN
  Administered 2015-11-28: 0.4 mg via INTRAVENOUS

## 2015-11-28 MED ORDER — SODIUM CHLORIDE 0.9 % IV SOLN
INTRAVENOUS | Status: DC | PRN
  Administered 2015-11-28: 15 ug/min via INTRAVENOUS

## 2015-11-28 MED ORDER — FENTANYL CITRATE (PF) 100 MCG/2ML IJ SOLN
INTRAMUSCULAR | Status: DC | PRN
  Administered 2015-11-28: 250 ug via INTRAVENOUS

## 2015-11-28 MED ORDER — CEFAZOLIN SODIUM-DEXTROSE 2-4 GM/100ML-% IV SOLN
INTRAVENOUS | Status: AC
  Filled 2015-11-28: qty 100

## 2015-11-28 MED ORDER — ESMOLOL HCL 100 MG/10ML IV SOLN
INTRAVENOUS | Status: DC | PRN
  Administered 2015-11-28: 40 mg via INTRAVENOUS
  Administered 2015-11-28: 60 mg via INTRAVENOUS

## 2015-11-28 MED ORDER — ONDANSETRON HCL 4 MG PO TABS: 4.0000 mg | ORAL_TABLET | Freq: Four times a day (QID) | ORAL | Status: DC | PRN

## 2015-11-28 MED ORDER — ONDANSETRON HCL 4 MG/2ML IJ SOLN
INTRAMUSCULAR | Status: AC
  Filled 2015-11-28: qty 2

## 2015-11-28 MED ORDER — FAMOTIDINE 20 MG PO TABS
20.0000 mg | ORAL_TABLET | Freq: Once | ORAL | Status: AC
  Administered 2015-11-28: 20 mg via ORAL

## 2015-11-28 MED ORDER — ONDANSETRON HCL 4 MG/2ML IJ SOLN
4.0000 mg | Freq: Four times a day (QID) | INTRAMUSCULAR | Status: DC | PRN
  Administered 2015-11-28: 4 mg via INTRAVENOUS

## 2015-11-28 MED ORDER — ROCURONIUM BROMIDE 100 MG/10ML IV SOLN
INTRAVENOUS | Status: DC | PRN
  Administered 2015-11-28: 20 mg via INTRAVENOUS
  Administered 2015-11-28: 10 mg via INTRAVENOUS

## 2015-11-28 MED ORDER — BUPIVACAINE HCL (PF) 0.5 % IJ SOLN
INTRAMUSCULAR | Status: AC
  Filled 2015-11-28: qty 30

## 2015-11-28 MED ORDER — METOPROLOL TARTRATE 5 MG/5ML IV SOLN
INTRAVENOUS | Status: DC | PRN
  Administered 2015-11-28: 5 mg via INTRAVENOUS

## 2015-11-28 MED ORDER — NEOMYCIN-POLYMYXIN B GU 40-200000 IR SOLN
Status: DC | PRN
  Administered 2015-11-28: 2 mL

## 2015-11-28 MED ORDER — HYDROCODONE-ACETAMINOPHEN 5-325 MG PO TABS: 1.0000 | ORAL_TABLET | Freq: Four times a day (QID) | ORAL | 0 refills | Status: DC | PRN

## 2015-11-28 SURGICAL SUPPLY — 36 items
BANDAGE ELASTIC 3 LF NS (GAUZE/BANDAGES/DRESSINGS) ×3 IMPLANT
BANDAGE ELASTIC 4 LF NS (GAUZE/BANDAGES/DRESSINGS) ×3 IMPLANT
BNDG COHESIVE 1X5 TAN NS LF (GAUZE/BANDAGES/DRESSINGS) ×3 IMPLANT
BNDG COHESIVE 4X5 TAN STRL (GAUZE/BANDAGES/DRESSINGS) ×3 IMPLANT
BNDG ESMARK 4X12 TAN STRL LF (GAUZE/BANDAGES/DRESSINGS) ×3 IMPLANT
BNDG GAUZE 1X2.1 STRL (MISCELLANEOUS) ×3 IMPLANT
CHLORAPREP W/TINT 26ML (MISCELLANEOUS) ×3 IMPLANT
CUFF TOURN 18 STER (MISCELLANEOUS) ×3 IMPLANT
DRAPE FLUOR MINI C-ARM 54X84 (DRAPES) ×3 IMPLANT
ELECT CAUTERY BLADE 6.4 (BLADE) ×3 IMPLANT
ELECT REM PT RETURN 9FT ADLT (ELECTROSURGICAL) ×3
ELECTRODE REM PT RTRN 9FT ADLT (ELECTROSURGICAL) ×1 IMPLANT
FUSION DEVICE 22.0 ACCTRAK (Screw) ×3 IMPLANT
GAUZE SPONGE 4X4 12PLY STRL (GAUZE/BANDAGES/DRESSINGS) ×3 IMPLANT
GLOVE BIOGEL PI IND STRL 9 (GLOVE) ×1 IMPLANT
GLOVE BIOGEL PI INDICATOR 9 (GLOVE) ×2
GLOVE SURG ORTHO 9.0 STRL STRW (GLOVE) ×3 IMPLANT
GOWN SRG 2XL LVL 4 RGLN SLV (GOWNS) ×1 IMPLANT
GOWN STRL NON-REIN 2XL LVL4 (GOWNS) ×2
GOWN STRL REUS W/ TWL LRG LVL3 (GOWN DISPOSABLE) ×1 IMPLANT
GOWN STRL REUS W/TWL LRG LVL3 (GOWN DISPOSABLE) ×2
GUIDEWIRE ORTHO 062 (WIRE) ×3 IMPLANT
IV CATH ANGIO 14GX3.25 ORG (MISCELLANEOUS) IMPLANT
KIT RM TURNOVER STRD PROC AR (KITS) ×3 IMPLANT
NEEDLE FILTER BLUNT 18X 1/2SAF (NEEDLE) ×2
NEEDLE FILTER BLUNT 18X1 1/2 (NEEDLE) ×1 IMPLANT
NEEDLE HYPO 25X1 1.5 SAFETY (NEEDLE) ×3 IMPLANT
NS IRRIG 500ML POUR BTL (IV SOLUTION) ×3 IMPLANT
PACK EXTREMITY ARMC (MISCELLANEOUS) ×3 IMPLANT
PAD PREP 24X41 OB/GYN DISP (PERSONAL CARE ITEMS) ×3 IMPLANT
PADDING CAST BLEND 4X4 NS (MISCELLANEOUS) ×3 IMPLANT
PUTTY BONE .5ML HUMAN (Tissue) ×3 IMPLANT
STOCKINETTE STRL 4IN 9604848 (GAUZE/BANDAGES/DRESSINGS) ×3 IMPLANT
SUT ETHIBOND 4-0 (SUTURE) ×3 IMPLANT
SUT ETHILON 5 0 CL P 3 (SUTURE) ×3 IMPLANT
SYRINGE 10CC LL (SYRINGE) ×3 IMPLANT

## 2015-11-28 NOTE — H&P (Signed)
Reviewed paper H+P, will be scanned into chart. No changes noted.  

## 2015-11-28 NOTE — Transfer of Care (Signed)
Immediate Anesthesia Transfer of Care Note  Patient: Kathryn Cain  Procedure(s) Performed: Procedure(s) with comments: DISTAL INTERPHALANGEAL JOINT FUSION (Right) - third finger  Patient Location: PACU  Anesthesia Type:General  Level of Consciousness: awake and patient cooperative  Airway & Oxygen Therapy: Patient Spontanous Breathing and Patient connected to nasal cannula oxygen  Post-op Assessment: Report given to RN and Post -op Vital signs reviewed and stable  Post vital signs: Reviewed and stable  Last Vitals:  Vitals:   11/28/15 1505 11/28/15 1506  BP:  (!) 152/87  Pulse:  68  Resp: 18   Temp:  37.1 C    Last Pain:  Vitals:   11/28/15 1505  TempSrc: Tympanic  PainSc: 7          Complications: No apparent anesthesia complications

## 2015-11-28 NOTE — Discharge Instructions (Addendum)
Keep hand elevated today and tomorrow is much as possible. To try to move the finger at all. Keep bandage clean and dry       AMBULATORY SURGERY  DISCHARGE INSTRUCTIONS   1) The drugs that you were given will stay in your system until tomorrow so for the next 24 hours you should not:  A) Drive an automobile B) Make any legal decisions C) Drink any alcoholic beverage   2) You may resume regular meals tomorrow.  Today it is better to start with liquids and gradually work up to solid foods.  You may eat anything you prefer, but it is better to start with liquids, then soup and crackers, and gradually work up to solid foods.   3) Please notify your doctor immediately if you have any unusual bleeding, trouble breathing, redness and pain at the surgery site, drainage, fever, or pain not relieved by medication.    4) Additional Instructions:        Please contact your physician with any problems or Same Day Surgery at [phone], Monday through Friday 6 am to 4 pm, or [place] at [facility] number at [phone].

## 2015-11-28 NOTE — Anesthesia Procedure Notes (Signed)
Procedure Name: Intubation Date/Time: 11/28/2015 5:49 PM Performed by: Rosaria Ferries, DAVID Pre-anesthesia Checklist: Patient identified, Emergency Drugs available, Suction available and Patient being monitored Patient Re-evaluated:Patient Re-evaluated prior to inductionOxygen Delivery Method: Circle system utilized Preoxygenation: Pre-oxygenation with 100% oxygen Intubation Type: IV induction Laryngoscope Size: Mac and 3 Grade View: Grade I Tube type: Oral Tube size: 7.0 mm Number of attempts: 1 Placement Confirmation: ETT inserted through vocal cords under direct vision,  positive ETCO2 and breath sounds checked- equal and bilateral Secured at: 21 cm Tube secured with: Tape Dental Injury: Teeth and Oropharynx as per pre-operative assessment

## 2015-11-28 NOTE — Anesthesia Preprocedure Evaluation (Signed)
Anesthesia Evaluation  Patient identified by MRN, date of birth, ID band Patient awake    Reviewed: Allergy & Precautions, H&P , NPO status , Patient's Chart, lab work & pertinent test results  History of Anesthesia Complications Negative for: history of anesthetic complications  Airway Mallampati: III  TM Distance: >3 FB Neck ROM: limited    Dental  (+) Poor Dentition, Missing, Edentulous Lower, Edentulous Upper   Pulmonary COPD, Current Smoker,    Pulmonary exam normal breath sounds clear to auscultation       Cardiovascular Exercise Tolerance: Poor hypertension, + angina with exertion + CAD, + Past MI, + CABG and + Peripheral Vascular Disease  Normal cardiovascular exam Rhythm:regular Rate:Normal     Neuro/Psych negative psych ROS   GI/Hepatic negative GI ROS, Neg liver ROS,   Endo/Other  negative endocrine ROS  Renal/GU Renal diseasenegative Renal ROS  negative genitourinary   Musculoskeletal  (+) Arthritis ,   Abdominal   Peds  Hematology negative hematology ROS (+)   Anesthesia Other Findings Past Medical History: No date: A-fib (HCC) No date: Anginal pain (HCC) No date: Arthritis No date: Chronic renal insufficiency No date: COPD (chronic obstructive pulmonary disease) (* No date: Coronary artery disease No date: Diverticulosis No date: Gout No date: Hyperlipidemia No date: Hypertension No date: Myocardial infarction ([place]) No date: Nephrolithiasis No date: Peripheral vascular disease (HCC) No date: Stroke [facility])  Past Surgical History: No date: APPENDECTOMY No date: BREAST SURGERY No date: CARDIAC SURGERY No date: CORONARY ARTERY BYPASS GRAFT No date: KIDNEY STONE SURGERY  BMI    Body Mass Index:  28.52 kg/m      Reproductive/Obstetrics negative OB ROS                             Anesthesia Physical Anesthesia Plan  ASA: IV  Anesthesia Plan: General LMA    Post-op Pain Management:    Induction:   Airway Management Planned:   Additional Equipment:   Intra-op Plan:   Post-operative Plan:   Informed Consent: I have reviewed the patients History and Physical, chart, labs and discussed the procedure including the risks, benefits and alternatives for the proposed anesthesia with the patient or authorized representative who has indicated his/her understanding and acceptance.   Dental Advisory Given  Plan Discussed with: Anesthesiologist, CRNA and Surgeon  Anesthesia Plan Comments: (Patient informed that they are higher risk for complications from anesthesia during this procedure due to their medical history.  Patient voiced understanding. )        Anesthesia Quick Evaluation

## 2015-11-28 NOTE — Anesthesia Postprocedure Evaluation (Signed)
Anesthesia Post Note  Patient: Kathryn Cain  Procedure(s) Performed: Procedure(s) (LRB): DISTAL INTERPHALANGEAL JOINT FUSION (Right)  Patient location during evaluation: PACU Anesthesia Type: General Level of consciousness: awake and alert Pain management: pain level controlled Vital Signs Assessment: post-procedure vital signs reviewed and stable Respiratory status: spontaneous breathing, nonlabored ventilation, respiratory function stable and patient connected to nasal cannula oxygen Cardiovascular status: blood pressure returned to baseline and stable Postop Assessment: no signs of nausea or vomiting Anesthetic complications: no    Last Vitals:  Vitals:   11/28/15 1955 11/28/15 2020  BP: 112/64 114/65  Pulse: 89 88  Resp: 16 16  Temp:  36.8 C    Last Pain:  Vitals:   11/28/15 2020  TempSrc:   PainSc: 0-No pain                 Precious Haws Nema Oatley

## 2015-11-28 NOTE — Op Note (Signed)
11/28/2015  6:52 PM  PATIENT:  Kathryn Cain  82 y.o. female  PRE-OPERATIVE DIAGNOSIS:  GOUT [place] right middle finger DIP joint  POST-OPERATIVE DIAGNOSIS:  GOUT ARTHRITIS,BONE DESTRUCTION same  PROCEDURE:  Procedure(s) with comments: DISTAL INTERPHALANGEAL JOINT FUSION (Right) - third finger  SURGEON: Laurene Footman, MD  ASSISTANTS: None  ANESTHESIA:   general  EBL:  Total I/O In: 700 [I.V.:700] Out: 10 [Blood:10]  BLOOD ADMINISTERED:none  DRAINS: none   LOCAL MEDICATIONS USED:  MARCAINE     SPECIMEN:  No Specimen  DISPOSITION OF SPECIMEN:  N/A  COUNTS:  YES  TOURNIQUET:   38 minutes at 250 mmHg  IMPLANTS: Acutrak screw 22 mm in length  DICTATION: .Dragon Dictation patient was brought to the operating room and after adequate general anesthesia was obtained, the right arm was prepped and draped in sterile fashion. A tourniquet was applied to the upper forearm and after wrapping and draped in sterile manner appropriate patient identification and timeout procedure were completed. A T-shaped incision was made over the DIP joint and a great deal of gouty tissue was removed. After thorough irrigation and debridement of this to the vitalized tissue the distal phalanx was identified and a K wire inserted through this out the tip this appeared to give be within the bone proximally and then the wart wire was then placed into the middle phalanx opening up the cortex. The condyles are a been destroyed by the gout for the middle phalanx. A 22 mm screw was then inserted through the fingertip after making a small stab incision and advanced until was within the distal portion of the tuft of the distal phalanx and into the canal of the middle phalanx with minimal gap present. The defect was filled with bone substitution with DBX putty to try to gain fusion. The wound was then closed with simple 4-0 nylon skin sutures and covered with Xeroform 4 x 4's Kling and a bias  wrap. Tourniquet was let down and patient center comes stable condition. A digital block was also given for postop analgesia with 10 cc of half percent Sensorcaine without epinephrine infiltrated on both sides of the base of the metacarpal head prior to the start of the case after general anesthesia been obtained.   PLAN OF CARE: Discharge to home after PACU  PATIENT DISPOSITION:  PACU - hemodynamically stable.

## 2015-11-29 ENCOUNTER — Encounter: Payer: Self-pay | Admitting: Orthopedic Surgery

## 2015-12-04 DIAGNOSIS — Z792 Long term (current) use of antibiotics: Secondary | ICD-10-CM | POA: Diagnosis not present

## 2015-12-04 DIAGNOSIS — I739 Peripheral vascular disease, unspecified: Secondary | ICD-10-CM | POA: Diagnosis not present

## 2015-12-04 DIAGNOSIS — Z4789 Encounter for other orthopedic aftercare: Secondary | ICD-10-CM | POA: Diagnosis not present

## 2015-12-04 DIAGNOSIS — Z951 Presence of aortocoronary bypass graft: Secondary | ICD-10-CM | POA: Diagnosis not present

## 2015-12-04 DIAGNOSIS — I25119 Atherosclerotic heart disease of native coronary artery with unspecified angina pectoris: Secondary | ICD-10-CM | POA: Diagnosis not present

## 2015-12-04 DIAGNOSIS — Z79891 Long term (current) use of opiate analgesic: Secondary | ICD-10-CM | POA: Diagnosis not present

## 2015-12-04 DIAGNOSIS — Z4801 Encounter for change or removal of surgical wound dressing: Secondary | ICD-10-CM | POA: Diagnosis not present

## 2015-12-04 DIAGNOSIS — I252 Old myocardial infarction: Secondary | ICD-10-CM | POA: Diagnosis not present

## 2015-12-04 DIAGNOSIS — I129 Hypertensive chronic kidney disease with stage 1 through stage 4 chronic kidney disease, or unspecified chronic kidney disease: Secondary | ICD-10-CM | POA: Diagnosis not present

## 2015-12-04 DIAGNOSIS — M1A9XX1 Chronic gout, unspecified, with tophus (tophi): Secondary | ICD-10-CM | POA: Diagnosis not present

## 2015-12-04 DIAGNOSIS — Z8673 Personal history of transient ischemic attack (TIA), and cerebral infarction without residual deficits: Secondary | ICD-10-CM | POA: Diagnosis not present

## 2015-12-04 DIAGNOSIS — Z9181 History of falling: Secondary | ICD-10-CM | POA: Diagnosis not present

## 2015-12-04 DIAGNOSIS — Z981 Arthrodesis status: Secondary | ICD-10-CM | POA: Diagnosis not present

## 2015-12-04 DIAGNOSIS — N189 Chronic kidney disease, unspecified: Secondary | ICD-10-CM | POA: Diagnosis not present

## 2015-12-04 DIAGNOSIS — E785 Hyperlipidemia, unspecified: Secondary | ICD-10-CM | POA: Diagnosis not present

## 2015-12-12 DIAGNOSIS — M2460 Ankylosis, unspecified joint: Secondary | ICD-10-CM | POA: Diagnosis not present

## 2015-12-18 DIAGNOSIS — I129 Hypertensive chronic kidney disease with stage 1 through stage 4 chronic kidney disease, or unspecified chronic kidney disease: Secondary | ICD-10-CM | POA: Diagnosis not present

## 2015-12-18 DIAGNOSIS — E785 Hyperlipidemia, unspecified: Secondary | ICD-10-CM | POA: Diagnosis not present

## 2015-12-18 DIAGNOSIS — Z9181 History of falling: Secondary | ICD-10-CM | POA: Diagnosis not present

## 2015-12-18 DIAGNOSIS — Z951 Presence of aortocoronary bypass graft: Secondary | ICD-10-CM | POA: Diagnosis not present

## 2015-12-18 DIAGNOSIS — Z8673 Personal history of transient ischemic attack (TIA), and cerebral infarction without residual deficits: Secondary | ICD-10-CM | POA: Diagnosis not present

## 2015-12-18 DIAGNOSIS — Z4801 Encounter for change or removal of surgical wound dressing: Secondary | ICD-10-CM | POA: Diagnosis not present

## 2015-12-18 DIAGNOSIS — Z4789 Encounter for other orthopedic aftercare: Secondary | ICD-10-CM | POA: Diagnosis not present

## 2015-12-18 DIAGNOSIS — Z79891 Long term (current) use of opiate analgesic: Secondary | ICD-10-CM | POA: Diagnosis not present

## 2015-12-18 DIAGNOSIS — I252 Old myocardial infarction: Secondary | ICD-10-CM | POA: Diagnosis not present

## 2015-12-18 DIAGNOSIS — I25119 Atherosclerotic heart disease of native coronary artery with unspecified angina pectoris: Secondary | ICD-10-CM | POA: Diagnosis not present

## 2015-12-18 DIAGNOSIS — I739 Peripheral vascular disease, unspecified: Secondary | ICD-10-CM | POA: Diagnosis not present

## 2015-12-18 DIAGNOSIS — N189 Chronic kidney disease, unspecified: Secondary | ICD-10-CM | POA: Diagnosis not present

## 2015-12-18 DIAGNOSIS — M1A9XX1 Chronic gout, unspecified, with tophus (tophi): Secondary | ICD-10-CM | POA: Diagnosis not present

## 2015-12-18 DIAGNOSIS — Z981 Arthrodesis status: Secondary | ICD-10-CM | POA: Diagnosis not present

## 2015-12-18 DIAGNOSIS — Z792 Long term (current) use of antibiotics: Secondary | ICD-10-CM | POA: Diagnosis not present

## 2015-12-23 DIAGNOSIS — I252 Old myocardial infarction: Secondary | ICD-10-CM | POA: Diagnosis not present

## 2015-12-23 DIAGNOSIS — Z4789 Encounter for other orthopedic aftercare: Secondary | ICD-10-CM | POA: Diagnosis not present

## 2015-12-23 DIAGNOSIS — E785 Hyperlipidemia, unspecified: Secondary | ICD-10-CM | POA: Diagnosis not present

## 2015-12-23 DIAGNOSIS — N189 Chronic kidney disease, unspecified: Secondary | ICD-10-CM | POA: Diagnosis not present

## 2015-12-23 DIAGNOSIS — I739 Peripheral vascular disease, unspecified: Secondary | ICD-10-CM | POA: Diagnosis not present

## 2015-12-23 DIAGNOSIS — Z4801 Encounter for change or removal of surgical wound dressing: Secondary | ICD-10-CM | POA: Diagnosis not present

## 2015-12-23 DIAGNOSIS — Z8673 Personal history of transient ischemic attack (TIA), and cerebral infarction without residual deficits: Secondary | ICD-10-CM | POA: Diagnosis not present

## 2015-12-23 DIAGNOSIS — Z79891 Long term (current) use of opiate analgesic: Secondary | ICD-10-CM | POA: Diagnosis not present

## 2015-12-23 DIAGNOSIS — I129 Hypertensive chronic kidney disease with stage 1 through stage 4 chronic kidney disease, or unspecified chronic kidney disease: Secondary | ICD-10-CM | POA: Diagnosis not present

## 2015-12-23 DIAGNOSIS — M1A9XX1 Chronic gout, unspecified, with tophus (tophi): Secondary | ICD-10-CM | POA: Diagnosis not present

## 2015-12-23 DIAGNOSIS — Z792 Long term (current) use of antibiotics: Secondary | ICD-10-CM | POA: Diagnosis not present

## 2015-12-23 DIAGNOSIS — Z9181 History of falling: Secondary | ICD-10-CM | POA: Diagnosis not present

## 2015-12-23 DIAGNOSIS — I25119 Atherosclerotic heart disease of native coronary artery with unspecified angina pectoris: Secondary | ICD-10-CM | POA: Diagnosis not present

## 2015-12-23 DIAGNOSIS — Z981 Arthrodesis status: Secondary | ICD-10-CM | POA: Diagnosis not present

## 2015-12-23 DIAGNOSIS — Z951 Presence of aortocoronary bypass graft: Secondary | ICD-10-CM | POA: Diagnosis not present

## 2015-12-27 DIAGNOSIS — M2460 Ankylosis, unspecified joint: Secondary | ICD-10-CM | POA: Diagnosis not present

## 2016-01-01 DIAGNOSIS — I129 Hypertensive chronic kidney disease with stage 1 through stage 4 chronic kidney disease, or unspecified chronic kidney disease: Secondary | ICD-10-CM | POA: Diagnosis not present

## 2016-01-01 DIAGNOSIS — Z8673 Personal history of transient ischemic attack (TIA), and cerebral infarction without residual deficits: Secondary | ICD-10-CM | POA: Diagnosis not present

## 2016-01-01 DIAGNOSIS — E785 Hyperlipidemia, unspecified: Secondary | ICD-10-CM | POA: Diagnosis not present

## 2016-01-01 DIAGNOSIS — Z4789 Encounter for other orthopedic aftercare: Secondary | ICD-10-CM | POA: Diagnosis not present

## 2016-01-01 DIAGNOSIS — I252 Old myocardial infarction: Secondary | ICD-10-CM | POA: Diagnosis not present

## 2016-01-01 DIAGNOSIS — Z981 Arthrodesis status: Secondary | ICD-10-CM | POA: Diagnosis not present

## 2016-01-01 DIAGNOSIS — Z792 Long term (current) use of antibiotics: Secondary | ICD-10-CM | POA: Diagnosis not present

## 2016-01-01 DIAGNOSIS — Z951 Presence of aortocoronary bypass graft: Secondary | ICD-10-CM | POA: Diagnosis not present

## 2016-01-01 DIAGNOSIS — N189 Chronic kidney disease, unspecified: Secondary | ICD-10-CM | POA: Diagnosis not present

## 2016-01-01 DIAGNOSIS — I25119 Atherosclerotic heart disease of native coronary artery with unspecified angina pectoris: Secondary | ICD-10-CM | POA: Diagnosis not present

## 2016-01-01 DIAGNOSIS — Z79891 Long term (current) use of opiate analgesic: Secondary | ICD-10-CM | POA: Diagnosis not present

## 2016-01-01 DIAGNOSIS — I739 Peripheral vascular disease, unspecified: Secondary | ICD-10-CM | POA: Diagnosis not present

## 2016-01-01 DIAGNOSIS — M1A9XX1 Chronic gout, unspecified, with tophus (tophi): Secondary | ICD-10-CM | POA: Diagnosis not present

## 2016-01-07 DIAGNOSIS — M1A9XX1 Chronic gout, unspecified, with tophus (tophi): Secondary | ICD-10-CM | POA: Diagnosis not present

## 2016-01-07 DIAGNOSIS — I129 Hypertensive chronic kidney disease with stage 1 through stage 4 chronic kidney disease, or unspecified chronic kidney disease: Secondary | ICD-10-CM | POA: Diagnosis not present

## 2016-01-07 DIAGNOSIS — Z79891 Long term (current) use of opiate analgesic: Secondary | ICD-10-CM | POA: Diagnosis not present

## 2016-01-07 DIAGNOSIS — E785 Hyperlipidemia, unspecified: Secondary | ICD-10-CM | POA: Diagnosis not present

## 2016-01-07 DIAGNOSIS — Z4789 Encounter for other orthopedic aftercare: Secondary | ICD-10-CM | POA: Diagnosis not present

## 2016-01-07 DIAGNOSIS — I252 Old myocardial infarction: Secondary | ICD-10-CM | POA: Diagnosis not present

## 2016-01-07 DIAGNOSIS — I739 Peripheral vascular disease, unspecified: Secondary | ICD-10-CM | POA: Diagnosis not present

## 2016-01-07 DIAGNOSIS — N189 Chronic kidney disease, unspecified: Secondary | ICD-10-CM | POA: Diagnosis not present

## 2016-01-07 DIAGNOSIS — Z951 Presence of aortocoronary bypass graft: Secondary | ICD-10-CM | POA: Diagnosis not present

## 2016-01-07 DIAGNOSIS — Z8673 Personal history of transient ischemic attack (TIA), and cerebral infarction without residual deficits: Secondary | ICD-10-CM | POA: Diagnosis not present

## 2016-01-07 DIAGNOSIS — I25119 Atherosclerotic heart disease of native coronary artery with unspecified angina pectoris: Secondary | ICD-10-CM | POA: Diagnosis not present

## 2016-01-07 DIAGNOSIS — Z981 Arthrodesis status: Secondary | ICD-10-CM | POA: Diagnosis not present

## 2016-01-07 DIAGNOSIS — Z792 Long term (current) use of antibiotics: Secondary | ICD-10-CM | POA: Diagnosis not present

## 2016-01-08 DIAGNOSIS — I252 Old myocardial infarction: Secondary | ICD-10-CM | POA: Diagnosis not present

## 2016-01-08 DIAGNOSIS — Z8673 Personal history of transient ischemic attack (TIA), and cerebral infarction without residual deficits: Secondary | ICD-10-CM | POA: Diagnosis not present

## 2016-01-08 DIAGNOSIS — N189 Chronic kidney disease, unspecified: Secondary | ICD-10-CM | POA: Diagnosis not present

## 2016-01-08 DIAGNOSIS — Z951 Presence of aortocoronary bypass graft: Secondary | ICD-10-CM | POA: Diagnosis not present

## 2016-01-08 DIAGNOSIS — E785 Hyperlipidemia, unspecified: Secondary | ICD-10-CM | POA: Diagnosis not present

## 2016-01-08 DIAGNOSIS — I25119 Atherosclerotic heart disease of native coronary artery with unspecified angina pectoris: Secondary | ICD-10-CM | POA: Diagnosis not present

## 2016-01-08 DIAGNOSIS — I129 Hypertensive chronic kidney disease with stage 1 through stage 4 chronic kidney disease, or unspecified chronic kidney disease: Secondary | ICD-10-CM | POA: Diagnosis not present

## 2016-01-08 DIAGNOSIS — M1A9XX1 Chronic gout, unspecified, with tophus (tophi): Secondary | ICD-10-CM | POA: Diagnosis not present

## 2016-01-08 DIAGNOSIS — Z4789 Encounter for other orthopedic aftercare: Secondary | ICD-10-CM | POA: Diagnosis not present

## 2016-01-08 DIAGNOSIS — I739 Peripheral vascular disease, unspecified: Secondary | ICD-10-CM | POA: Diagnosis not present

## 2016-01-08 DIAGNOSIS — Z981 Arthrodesis status: Secondary | ICD-10-CM | POA: Diagnosis not present

## 2016-01-08 DIAGNOSIS — Z79891 Long term (current) use of opiate analgesic: Secondary | ICD-10-CM | POA: Diagnosis not present

## 2016-01-08 DIAGNOSIS — Z792 Long term (current) use of antibiotics: Secondary | ICD-10-CM | POA: Diagnosis not present

## 2016-01-13 DIAGNOSIS — Z79891 Long term (current) use of opiate analgesic: Secondary | ICD-10-CM | POA: Diagnosis not present

## 2016-01-13 DIAGNOSIS — I129 Hypertensive chronic kidney disease with stage 1 through stage 4 chronic kidney disease, or unspecified chronic kidney disease: Secondary | ICD-10-CM | POA: Diagnosis not present

## 2016-01-13 DIAGNOSIS — Z4789 Encounter for other orthopedic aftercare: Secondary | ICD-10-CM | POA: Diagnosis not present

## 2016-01-13 DIAGNOSIS — I25119 Atherosclerotic heart disease of native coronary artery with unspecified angina pectoris: Secondary | ICD-10-CM | POA: Diagnosis not present

## 2016-01-13 DIAGNOSIS — N189 Chronic kidney disease, unspecified: Secondary | ICD-10-CM | POA: Diagnosis not present

## 2016-01-13 DIAGNOSIS — I739 Peripheral vascular disease, unspecified: Secondary | ICD-10-CM | POA: Diagnosis not present

## 2016-01-13 DIAGNOSIS — E785 Hyperlipidemia, unspecified: Secondary | ICD-10-CM | POA: Diagnosis not present

## 2016-01-13 DIAGNOSIS — I252 Old myocardial infarction: Secondary | ICD-10-CM | POA: Diagnosis not present

## 2016-01-13 DIAGNOSIS — Z792 Long term (current) use of antibiotics: Secondary | ICD-10-CM | POA: Diagnosis not present

## 2016-01-13 DIAGNOSIS — Z981 Arthrodesis status: Secondary | ICD-10-CM | POA: Diagnosis not present

## 2016-01-13 DIAGNOSIS — M1A9XX1 Chronic gout, unspecified, with tophus (tophi): Secondary | ICD-10-CM | POA: Diagnosis not present

## 2016-01-13 DIAGNOSIS — Z8673 Personal history of transient ischemic attack (TIA), and cerebral infarction without residual deficits: Secondary | ICD-10-CM | POA: Diagnosis not present

## 2016-01-13 DIAGNOSIS — Z951 Presence of aortocoronary bypass graft: Secondary | ICD-10-CM | POA: Diagnosis not present

## 2016-01-14 DIAGNOSIS — Z951 Presence of aortocoronary bypass graft: Secondary | ICD-10-CM | POA: Diagnosis not present

## 2016-01-14 DIAGNOSIS — I10 Essential (primary) hypertension: Secondary | ICD-10-CM | POA: Diagnosis not present

## 2016-01-14 DIAGNOSIS — E785 Hyperlipidemia, unspecified: Secondary | ICD-10-CM | POA: Diagnosis not present

## 2016-01-14 DIAGNOSIS — Z0000 Encounter for general adult medical examination without abnormal findings: Secondary | ICD-10-CM | POA: Diagnosis not present

## 2016-01-14 DIAGNOSIS — M1000 Idiopathic gout, unspecified site: Secondary | ICD-10-CM | POA: Diagnosis not present

## 2016-01-14 DIAGNOSIS — Z4789 Encounter for other orthopedic aftercare: Secondary | ICD-10-CM | POA: Diagnosis not present

## 2016-01-14 DIAGNOSIS — M1A9XX1 Chronic gout, unspecified, with tophus (tophi): Secondary | ICD-10-CM | POA: Diagnosis not present

## 2016-01-14 DIAGNOSIS — Z792 Long term (current) use of antibiotics: Secondary | ICD-10-CM | POA: Diagnosis not present

## 2016-01-14 DIAGNOSIS — F4321 Adjustment disorder with depressed mood: Secondary | ICD-10-CM | POA: Diagnosis not present

## 2016-01-14 DIAGNOSIS — N189 Chronic kidney disease, unspecified: Secondary | ICD-10-CM | POA: Diagnosis not present

## 2016-01-14 DIAGNOSIS — I252 Old myocardial infarction: Secondary | ICD-10-CM | POA: Diagnosis not present

## 2016-01-14 DIAGNOSIS — I739 Peripheral vascular disease, unspecified: Secondary | ICD-10-CM | POA: Diagnosis not present

## 2016-01-14 DIAGNOSIS — Z981 Arthrodesis status: Secondary | ICD-10-CM | POA: Diagnosis not present

## 2016-01-14 DIAGNOSIS — I25119 Atherosclerotic heart disease of native coronary artery with unspecified angina pectoris: Secondary | ICD-10-CM | POA: Diagnosis not present

## 2016-01-14 DIAGNOSIS — Z8673 Personal history of transient ischemic attack (TIA), and cerebral infarction without residual deficits: Secondary | ICD-10-CM | POA: Diagnosis not present

## 2016-01-14 DIAGNOSIS — I129 Hypertensive chronic kidney disease with stage 1 through stage 4 chronic kidney disease, or unspecified chronic kidney disease: Secondary | ICD-10-CM | POA: Diagnosis not present

## 2016-01-14 DIAGNOSIS — Z79891 Long term (current) use of opiate analgesic: Secondary | ICD-10-CM | POA: Diagnosis not present

## 2016-01-16 DIAGNOSIS — Z981 Arthrodesis status: Secondary | ICD-10-CM | POA: Diagnosis not present

## 2016-01-16 DIAGNOSIS — I129 Hypertensive chronic kidney disease with stage 1 through stage 4 chronic kidney disease, or unspecified chronic kidney disease: Secondary | ICD-10-CM | POA: Diagnosis not present

## 2016-01-16 DIAGNOSIS — Z8673 Personal history of transient ischemic attack (TIA), and cerebral infarction without residual deficits: Secondary | ICD-10-CM | POA: Diagnosis not present

## 2016-01-16 DIAGNOSIS — Z951 Presence of aortocoronary bypass graft: Secondary | ICD-10-CM | POA: Diagnosis not present

## 2016-01-16 DIAGNOSIS — Z79891 Long term (current) use of opiate analgesic: Secondary | ICD-10-CM | POA: Diagnosis not present

## 2016-01-16 DIAGNOSIS — I25119 Atherosclerotic heart disease of native coronary artery with unspecified angina pectoris: Secondary | ICD-10-CM | POA: Diagnosis not present

## 2016-01-16 DIAGNOSIS — I252 Old myocardial infarction: Secondary | ICD-10-CM | POA: Diagnosis not present

## 2016-01-16 DIAGNOSIS — I739 Peripheral vascular disease, unspecified: Secondary | ICD-10-CM | POA: Diagnosis not present

## 2016-01-16 DIAGNOSIS — Z4789 Encounter for other orthopedic aftercare: Secondary | ICD-10-CM | POA: Diagnosis not present

## 2016-01-16 DIAGNOSIS — E785 Hyperlipidemia, unspecified: Secondary | ICD-10-CM | POA: Diagnosis not present

## 2016-01-16 DIAGNOSIS — N189 Chronic kidney disease, unspecified: Secondary | ICD-10-CM | POA: Diagnosis not present

## 2016-01-16 DIAGNOSIS — M1A9XX1 Chronic gout, unspecified, with tophus (tophi): Secondary | ICD-10-CM | POA: Diagnosis not present

## 2016-01-16 DIAGNOSIS — Z792 Long term (current) use of antibiotics: Secondary | ICD-10-CM | POA: Diagnosis not present

## 2016-01-21 DIAGNOSIS — I252 Old myocardial infarction: Secondary | ICD-10-CM | POA: Diagnosis not present

## 2016-01-21 DIAGNOSIS — I739 Peripheral vascular disease, unspecified: Secondary | ICD-10-CM | POA: Diagnosis not present

## 2016-01-21 DIAGNOSIS — I129 Hypertensive chronic kidney disease with stage 1 through stage 4 chronic kidney disease, or unspecified chronic kidney disease: Secondary | ICD-10-CM | POA: Diagnosis not present

## 2016-01-21 DIAGNOSIS — Z951 Presence of aortocoronary bypass graft: Secondary | ICD-10-CM | POA: Diagnosis not present

## 2016-01-21 DIAGNOSIS — Z792 Long term (current) use of antibiotics: Secondary | ICD-10-CM | POA: Diagnosis not present

## 2016-01-21 DIAGNOSIS — Z79891 Long term (current) use of opiate analgesic: Secondary | ICD-10-CM | POA: Diagnosis not present

## 2016-01-21 DIAGNOSIS — N189 Chronic kidney disease, unspecified: Secondary | ICD-10-CM | POA: Diagnosis not present

## 2016-01-21 DIAGNOSIS — M1A9XX1 Chronic gout, unspecified, with tophus (tophi): Secondary | ICD-10-CM | POA: Diagnosis not present

## 2016-01-21 DIAGNOSIS — E785 Hyperlipidemia, unspecified: Secondary | ICD-10-CM | POA: Diagnosis not present

## 2016-01-21 DIAGNOSIS — Z8673 Personal history of transient ischemic attack (TIA), and cerebral infarction without residual deficits: Secondary | ICD-10-CM | POA: Diagnosis not present

## 2016-01-21 DIAGNOSIS — I25119 Atherosclerotic heart disease of native coronary artery with unspecified angina pectoris: Secondary | ICD-10-CM | POA: Diagnosis not present

## 2016-01-21 DIAGNOSIS — Z4789 Encounter for other orthopedic aftercare: Secondary | ICD-10-CM | POA: Diagnosis not present

## 2016-01-21 DIAGNOSIS — Z981 Arthrodesis status: Secondary | ICD-10-CM | POA: Diagnosis not present

## 2016-01-28 DIAGNOSIS — Z981 Arthrodesis status: Secondary | ICD-10-CM | POA: Diagnosis not present

## 2016-01-28 DIAGNOSIS — Z79891 Long term (current) use of opiate analgesic: Secondary | ICD-10-CM | POA: Diagnosis not present

## 2016-01-28 DIAGNOSIS — Z792 Long term (current) use of antibiotics: Secondary | ICD-10-CM | POA: Diagnosis not present

## 2016-01-28 DIAGNOSIS — E785 Hyperlipidemia, unspecified: Secondary | ICD-10-CM | POA: Diagnosis not present

## 2016-01-28 DIAGNOSIS — Z4789 Encounter for other orthopedic aftercare: Secondary | ICD-10-CM | POA: Diagnosis not present

## 2016-01-28 DIAGNOSIS — I252 Old myocardial infarction: Secondary | ICD-10-CM | POA: Diagnosis not present

## 2016-01-28 DIAGNOSIS — I129 Hypertensive chronic kidney disease with stage 1 through stage 4 chronic kidney disease, or unspecified chronic kidney disease: Secondary | ICD-10-CM | POA: Diagnosis not present

## 2016-01-28 DIAGNOSIS — N189 Chronic kidney disease, unspecified: Secondary | ICD-10-CM | POA: Diagnosis not present

## 2016-01-28 DIAGNOSIS — Z8673 Personal history of transient ischemic attack (TIA), and cerebral infarction without residual deficits: Secondary | ICD-10-CM | POA: Diagnosis not present

## 2016-01-28 DIAGNOSIS — M1A9XX1 Chronic gout, unspecified, with tophus (tophi): Secondary | ICD-10-CM | POA: Diagnosis not present

## 2016-01-28 DIAGNOSIS — I739 Peripheral vascular disease, unspecified: Secondary | ICD-10-CM | POA: Diagnosis not present

## 2016-01-28 DIAGNOSIS — Z951 Presence of aortocoronary bypass graft: Secondary | ICD-10-CM | POA: Diagnosis not present

## 2016-01-28 DIAGNOSIS — I25119 Atherosclerotic heart disease of native coronary artery with unspecified angina pectoris: Secondary | ICD-10-CM | POA: Diagnosis not present

## 2016-01-29 DIAGNOSIS — M898X9 Other specified disorders of bone, unspecified site: Secondary | ICD-10-CM | POA: Diagnosis not present

## 2016-02-03 DIAGNOSIS — M1A9XX1 Chronic gout, unspecified, with tophus (tophi): Secondary | ICD-10-CM | POA: Diagnosis not present

## 2016-02-03 DIAGNOSIS — I25119 Atherosclerotic heart disease of native coronary artery with unspecified angina pectoris: Secondary | ICD-10-CM | POA: Diagnosis not present

## 2016-02-03 DIAGNOSIS — Z981 Arthrodesis status: Secondary | ICD-10-CM | POA: Diagnosis not present

## 2016-02-03 DIAGNOSIS — T8131XD Disruption of external operation (surgical) wound, not elsewhere classified, subsequent encounter: Secondary | ICD-10-CM | POA: Diagnosis not present

## 2016-02-03 DIAGNOSIS — Z951 Presence of aortocoronary bypass graft: Secondary | ICD-10-CM | POA: Diagnosis not present

## 2016-02-03 DIAGNOSIS — Z9181 History of falling: Secondary | ICD-10-CM | POA: Diagnosis not present

## 2016-02-03 DIAGNOSIS — I739 Peripheral vascular disease, unspecified: Secondary | ICD-10-CM | POA: Diagnosis not present

## 2016-02-03 DIAGNOSIS — Z79891 Long term (current) use of opiate analgesic: Secondary | ICD-10-CM | POA: Diagnosis not present

## 2016-02-03 DIAGNOSIS — I129 Hypertensive chronic kidney disease with stage 1 through stage 4 chronic kidney disease, or unspecified chronic kidney disease: Secondary | ICD-10-CM | POA: Diagnosis not present

## 2016-02-03 DIAGNOSIS — E785 Hyperlipidemia, unspecified: Secondary | ICD-10-CM | POA: Diagnosis not present

## 2016-02-03 DIAGNOSIS — Z792 Long term (current) use of antibiotics: Secondary | ICD-10-CM | POA: Diagnosis not present

## 2016-02-03 DIAGNOSIS — N189 Chronic kidney disease, unspecified: Secondary | ICD-10-CM | POA: Diagnosis not present

## 2016-02-03 DIAGNOSIS — Z8673 Personal history of transient ischemic attack (TIA), and cerebral infarction without residual deficits: Secondary | ICD-10-CM | POA: Diagnosis not present

## 2016-02-11 DIAGNOSIS — I25119 Atherosclerotic heart disease of native coronary artery with unspecified angina pectoris: Secondary | ICD-10-CM | POA: Diagnosis not present

## 2016-02-11 DIAGNOSIS — M1A9XX1 Chronic gout, unspecified, with tophus (tophi): Secondary | ICD-10-CM | POA: Diagnosis not present

## 2016-02-11 DIAGNOSIS — Z79891 Long term (current) use of opiate analgesic: Secondary | ICD-10-CM | POA: Diagnosis not present

## 2016-02-11 DIAGNOSIS — Z981 Arthrodesis status: Secondary | ICD-10-CM | POA: Diagnosis not present

## 2016-02-11 DIAGNOSIS — I739 Peripheral vascular disease, unspecified: Secondary | ICD-10-CM | POA: Diagnosis not present

## 2016-02-11 DIAGNOSIS — Z9181 History of falling: Secondary | ICD-10-CM | POA: Diagnosis not present

## 2016-02-11 DIAGNOSIS — Z8673 Personal history of transient ischemic attack (TIA), and cerebral infarction without residual deficits: Secondary | ICD-10-CM | POA: Diagnosis not present

## 2016-02-11 DIAGNOSIS — I129 Hypertensive chronic kidney disease with stage 1 through stage 4 chronic kidney disease, or unspecified chronic kidney disease: Secondary | ICD-10-CM | POA: Diagnosis not present

## 2016-02-11 DIAGNOSIS — N189 Chronic kidney disease, unspecified: Secondary | ICD-10-CM | POA: Diagnosis not present

## 2016-02-11 DIAGNOSIS — T8131XD Disruption of external operation (surgical) wound, not elsewhere classified, subsequent encounter: Secondary | ICD-10-CM | POA: Diagnosis not present

## 2016-02-11 DIAGNOSIS — Z792 Long term (current) use of antibiotics: Secondary | ICD-10-CM | POA: Diagnosis not present

## 2016-02-11 DIAGNOSIS — Z951 Presence of aortocoronary bypass graft: Secondary | ICD-10-CM | POA: Diagnosis not present

## 2016-02-11 DIAGNOSIS — E785 Hyperlipidemia, unspecified: Secondary | ICD-10-CM | POA: Diagnosis not present

## 2016-02-19 DIAGNOSIS — I129 Hypertensive chronic kidney disease with stage 1 through stage 4 chronic kidney disease, or unspecified chronic kidney disease: Secondary | ICD-10-CM | POA: Diagnosis not present

## 2016-02-19 DIAGNOSIS — E785 Hyperlipidemia, unspecified: Secondary | ICD-10-CM | POA: Diagnosis not present

## 2016-02-19 DIAGNOSIS — Z792 Long term (current) use of antibiotics: Secondary | ICD-10-CM | POA: Diagnosis not present

## 2016-02-19 DIAGNOSIS — I25119 Atherosclerotic heart disease of native coronary artery with unspecified angina pectoris: Secondary | ICD-10-CM | POA: Diagnosis not present

## 2016-02-19 DIAGNOSIS — I739 Peripheral vascular disease, unspecified: Secondary | ICD-10-CM | POA: Diagnosis not present

## 2016-02-19 DIAGNOSIS — Z8673 Personal history of transient ischemic attack (TIA), and cerebral infarction without residual deficits: Secondary | ICD-10-CM | POA: Diagnosis not present

## 2016-02-19 DIAGNOSIS — N189 Chronic kidney disease, unspecified: Secondary | ICD-10-CM | POA: Diagnosis not present

## 2016-02-19 DIAGNOSIS — Z981 Arthrodesis status: Secondary | ICD-10-CM | POA: Diagnosis not present

## 2016-02-19 DIAGNOSIS — Z951 Presence of aortocoronary bypass graft: Secondary | ICD-10-CM | POA: Diagnosis not present

## 2016-02-19 DIAGNOSIS — T8131XD Disruption of external operation (surgical) wound, not elsewhere classified, subsequent encounter: Secondary | ICD-10-CM | POA: Diagnosis not present

## 2016-02-19 DIAGNOSIS — Z9181 History of falling: Secondary | ICD-10-CM | POA: Diagnosis not present

## 2016-02-19 DIAGNOSIS — Z79891 Long term (current) use of opiate analgesic: Secondary | ICD-10-CM | POA: Diagnosis not present

## 2016-02-19 DIAGNOSIS — M1A9XX1 Chronic gout, unspecified, with tophus (tophi): Secondary | ICD-10-CM | POA: Diagnosis not present

## 2016-02-26 DIAGNOSIS — N189 Chronic kidney disease, unspecified: Secondary | ICD-10-CM | POA: Diagnosis not present

## 2016-02-26 DIAGNOSIS — I25119 Atherosclerotic heart disease of native coronary artery with unspecified angina pectoris: Secondary | ICD-10-CM | POA: Diagnosis not present

## 2016-02-26 DIAGNOSIS — Z8673 Personal history of transient ischemic attack (TIA), and cerebral infarction without residual deficits: Secondary | ICD-10-CM | POA: Diagnosis not present

## 2016-02-26 DIAGNOSIS — Z79891 Long term (current) use of opiate analgesic: Secondary | ICD-10-CM | POA: Diagnosis not present

## 2016-02-26 DIAGNOSIS — E785 Hyperlipidemia, unspecified: Secondary | ICD-10-CM | POA: Diagnosis not present

## 2016-02-26 DIAGNOSIS — Z951 Presence of aortocoronary bypass graft: Secondary | ICD-10-CM | POA: Diagnosis not present

## 2016-02-26 DIAGNOSIS — M1A9XX1 Chronic gout, unspecified, with tophus (tophi): Secondary | ICD-10-CM | POA: Diagnosis not present

## 2016-02-26 DIAGNOSIS — M898X9 Other specified disorders of bone, unspecified site: Secondary | ICD-10-CM | POA: Diagnosis not present

## 2016-02-26 DIAGNOSIS — Z981 Arthrodesis status: Secondary | ICD-10-CM | POA: Diagnosis not present

## 2016-02-26 DIAGNOSIS — T8131XD Disruption of external operation (surgical) wound, not elsewhere classified, subsequent encounter: Secondary | ICD-10-CM | POA: Diagnosis not present

## 2016-02-26 DIAGNOSIS — I739 Peripheral vascular disease, unspecified: Secondary | ICD-10-CM | POA: Diagnosis not present

## 2016-02-26 DIAGNOSIS — Z792 Long term (current) use of antibiotics: Secondary | ICD-10-CM | POA: Diagnosis not present

## 2016-02-26 DIAGNOSIS — I129 Hypertensive chronic kidney disease with stage 1 through stage 4 chronic kidney disease, or unspecified chronic kidney disease: Secondary | ICD-10-CM | POA: Diagnosis not present

## 2016-02-26 DIAGNOSIS — Z9181 History of falling: Secondary | ICD-10-CM | POA: Diagnosis not present

## 2016-02-27 DIAGNOSIS — F329 Major depressive disorder, single episode, unspecified: Secondary | ICD-10-CM | POA: Diagnosis not present

## 2016-02-27 DIAGNOSIS — Z720 Tobacco use: Secondary | ICD-10-CM | POA: Diagnosis not present

## 2016-03-09 DIAGNOSIS — N183 Chronic kidney disease, stage 3 (moderate): Secondary | ICD-10-CM | POA: Diagnosis not present

## 2016-03-09 DIAGNOSIS — M79644 Pain in right finger(s): Secondary | ICD-10-CM | POA: Diagnosis not present

## 2016-03-09 DIAGNOSIS — M1A9XX1 Chronic gout, unspecified, with tophus (tophi): Secondary | ICD-10-CM | POA: Diagnosis not present

## 2016-03-11 DIAGNOSIS — M2460 Ankylosis, unspecified joint: Secondary | ICD-10-CM | POA: Diagnosis not present

## 2016-03-11 DIAGNOSIS — M898X9 Other specified disorders of bone, unspecified site: Secondary | ICD-10-CM | POA: Diagnosis not present

## 2016-03-16 DIAGNOSIS — Z23 Encounter for immunization: Secondary | ICD-10-CM | POA: Diagnosis not present

## 2016-03-16 DIAGNOSIS — I10 Essential (primary) hypertension: Secondary | ICD-10-CM | POA: Diagnosis not present

## 2016-03-16 DIAGNOSIS — F329 Major depressive disorder, single episode, unspecified: Secondary | ICD-10-CM | POA: Diagnosis not present

## 2016-03-16 DIAGNOSIS — R7303 Prediabetes: Secondary | ICD-10-CM | POA: Diagnosis not present

## 2016-03-16 DIAGNOSIS — Z720 Tobacco use: Secondary | ICD-10-CM | POA: Diagnosis not present

## 2016-03-28 ENCOUNTER — Ambulatory Visit
Admission: EM | Admit: 2016-03-28 | Discharge: 2016-03-28 | Disposition: A | Discharge status: Other Acute Inpatient Hospital | Payer: PPO | Attending: Family Medicine | Admitting: Family Medicine

## 2016-03-28 ENCOUNTER — Emergency Department
Admission: EM | Admit: 2016-03-28 | Discharge: 2016-03-28 | Disposition: A | Discharge status: Home / Self Care | Payer: PPO | Attending: Emergency Medicine | Admitting: Emergency Medicine

## 2016-03-28 ENCOUNTER — Encounter: Payer: Self-pay | Admitting: Emergency Medicine

## 2016-03-28 ENCOUNTER — Emergency Department: Payer: PPO

## 2016-03-28 DIAGNOSIS — Z7982 Long term (current) use of aspirin: Secondary | ICD-10-CM | POA: Diagnosis not present

## 2016-03-28 DIAGNOSIS — J449 Chronic obstructive pulmonary disease, unspecified: Secondary | ICD-10-CM | POA: Diagnosis not present

## 2016-03-28 DIAGNOSIS — Z79899 Other long term (current) drug therapy: Secondary | ICD-10-CM | POA: Insufficient documentation

## 2016-03-28 DIAGNOSIS — I10 Essential (primary) hypertension: Secondary | ICD-10-CM | POA: Diagnosis not present

## 2016-03-28 DIAGNOSIS — F17210 Nicotine dependence, cigarettes, uncomplicated: Secondary | ICD-10-CM | POA: Diagnosis not present

## 2016-03-28 DIAGNOSIS — M86141 Other acute osteomyelitis, right hand: Secondary | ICD-10-CM | POA: Diagnosis not present

## 2016-03-28 DIAGNOSIS — M869 Osteomyelitis, unspecified: Secondary | ICD-10-CM

## 2016-03-28 DIAGNOSIS — M7989 Other specified soft tissue disorders: Secondary | ICD-10-CM | POA: Diagnosis not present

## 2016-03-28 DIAGNOSIS — L089 Local infection of the skin and subcutaneous tissue, unspecified: Secondary | ICD-10-CM | POA: Diagnosis not present

## 2016-03-28 DIAGNOSIS — I2510 Atherosclerotic heart disease of native coronary artery without angina pectoris: Secondary | ICD-10-CM | POA: Insufficient documentation

## 2016-03-28 DIAGNOSIS — M86241 Subacute osteomyelitis, right hand: Secondary | ICD-10-CM | POA: Diagnosis not present

## 2016-03-28 LAB — COMPREHENSIVE METABOLIC PANEL
ALBUMIN: 3.7 g/dL (ref 3.5–5.0)
ALK PHOS: 91 U/L (ref 38–126)
ALT: 17 U/L (ref 14–54)
ANION GAP: 9 (ref 5–15)
AST: 29 U/L (ref 15–41)
BUN: 23 mg/dL — ABNORMAL HIGH (ref 6–20)
CO2: 20 mmol/L — AB (ref 22–32)
Calcium: 9.3 mg/dL (ref 8.9–10.3)
Chloride: 108 mmol/L (ref 101–111)
Creatinine, Ser: 2.04 mg/dL — ABNORMAL HIGH (ref 0.44–1.00)
GFR calc Af Amer: 25 mL/min — ABNORMAL LOW (ref >60)
GFR calc non Af Amer: 22 mL/min — ABNORMAL LOW (ref >60)
GLUCOSE: 117 mg/dL — AB (ref 65–99)
POTASSIUM: 4.4 mmol/L (ref 3.5–5.1)
SODIUM: 137 mmol/L (ref 135–145)
Total Bilirubin: 0.5 mg/dL (ref 0.3–1.2)
Total Protein: 7.6 g/dL (ref 6.5–8.1)

## 2016-03-28 LAB — CBC
HCT: 32.9 % — ABNORMAL LOW (ref 35.0–47.0)
HEMOGLOBIN: 11.2 g/dL — AB (ref 12.0–16.0)
MCH: 30.9 pg (ref 26.0–34.0)
MCHC: 33.9 g/dL (ref 32.0–36.0)
MCV: 91.4 fL (ref 80.0–100.0)
Platelets: 183 10*3/uL (ref 150–440)
RBC: 3.6 MIL/uL — ABNORMAL LOW (ref 3.80–5.20)
RDW: 17.9 % — ABNORMAL HIGH (ref 11.5–14.5)
WBC: 7.7 10*3/uL (ref 3.6–11.0)

## 2016-03-28 MED ORDER — ONDANSETRON 4 MG PO TBDP
4.0000 mg | ORAL_TABLET | Freq: Once | ORAL | Status: AC
  Administered 2016-03-28: 4 mg via ORAL
  Filled 2016-03-28: qty 1

## 2016-03-28 MED ORDER — CLINDAMYCIN HCL 150 MG PO CAPS
300.0000 mg | ORAL_CAPSULE | Freq: Once | ORAL | Status: AC
  Administered 2016-03-28: 300 mg via ORAL
  Filled 2016-03-28: qty 2

## 2016-03-28 MED ORDER — CLINDAMYCIN HCL 300 MG PO CAPS: 300.0000 mg | ORAL_CAPSULE | Freq: Three times a day (TID) | ORAL | 0 refills | Status: DC

## 2016-03-28 MED ORDER — OXYCODONE-ACETAMINOPHEN 5-325 MG PO TABS
1.0000 | ORAL_TABLET | Freq: Once | ORAL | Status: AC
  Administered 2016-03-28: 1 via ORAL
  Filled 2016-03-28: qty 1

## 2016-03-28 MED ORDER — OXYCODONE-ACETAMINOPHEN 5-325 MG PO TABS: 2.0000 | ORAL_TABLET | Freq: Four times a day (QID) | ORAL | 0 refills | Status: DC | PRN

## 2016-03-28 NOTE — ED Provider Notes (Signed)
[facility] Emergency Department Provider Note        Time seen: ----------------------------------------- 4:47 PM on 03/28/2016 -----------------------------------------    I have reviewed the triage vital signs and the nursing notes.   HISTORY  Chief Complaint Wound Infection    HPI Kathryn Cain is a 82 y.o. female who presents to the ER for right third digit pain and swelling. Patient states the finger has been dark colored since his surgery but has noticed increased swelling and spontaneous drainage from the distal aspect of the right third digit dorsally 2 days ago. Patient states he had surgery in July of this year and had a pin placed in the distal aspect of the finger. She has not had fevers or chills, notes worsening pain and swelling with spontaneous drainage.   Past Medical History:  Diagnosis Date  . A-fib ([place])   . Anginal pain ([place])   . Arthritis   . Chronic renal insufficiency   . COPD (chronic obstructive pulmonary disease) ([place])   . Coronary artery disease   . Diverticulosis   . Gout   . Hyperlipidemia   . Hypertension   . Myocardial infarction   . Nephrolithiasis   . Peripheral vascular disease ([place])   . Stroke [facility])     There are no active problems to display for this patient.   Past Surgical History:  Procedure Laterality Date  . APPENDECTOMY    . BREAST SURGERY    . CARDIAC SURGERY    . CORONARY ARTERY BYPASS GRAFT    . DISTAL INTERPHALANGEAL JOINT FUSION Right 11/28/2015   Procedure: DISTAL INTERPHALANGEAL JOINT FUSION;  Surgeon: Hessie Knows, MD;  Location: [facility] ORS;  Service: Orthopedics;  Laterality: Right;  third finger  . KIDNEY STONE SURGERY      Allergies Ciprofloxacin hcl and Codeine  Social History Social History  Substance Use Topics  . Smoking status: Current Some Day Smoker    Types: Cigarettes  . Smokeless tobacco: Never Used  . Alcohol use No   Review of Systems Constitutional: Negative  for fever. Cardiovascular: Negative for chest pain. Respiratory: Negative for shortness of breath. Gastrointestinal: Negative for abdominal pain, vomiting and diarrhea. Musculoskeletal: Positive for right third digit pain, distal swelling Skin: Positive for drainage from the right third digit distally Neurological: Negative for headaches, focal weakness or numbness.  10-point ROS otherwise negative.  ____________________________________________   PHYSICAL EXAM:  VITAL SIGNS: ED Triage Vitals  Enc Vitals Group     BP 03/28/16 1429 (!) 167/83     Pulse Rate 03/28/16 1429 80     Resp 03/28/16 1429 20     Temp 03/28/16 1429 97.8 F (36.6 C)     Temp Source 03/28/16 1429 Oral     SpO2 03/28/16 1429 100 %     Weight 03/28/16 1430 145 lb (65.8 kg)     Height 03/28/16 1430 5\' 4"  (1.626 m)     Head Circumference --      Peak Flow --      Pain Score 03/28/16 1430 10     Pain Loc --      Pain Edu? --      Excl. in [place]? --     Constitutional: Alert and oriented. Well appearing and in no distress. Eyes: Conjunctivae are normal. PERRL. Normal extraocular movements. ENT   Head: Normocephalic and atraumatic.   Nose: No congestion/rhinnorhea.   Mouth/Throat: Mucous membranes are moist.   Neck: No stridor. Cardiovascular: Normal rate, regular  rhythm. No murmurs, rubs, or gallops. Respiratory: Normal respiratory effort without tachypnea nor retractions. Breath sounds are clear and equal bilaterally. No wheezes/rales/rhonchi. Musculoskeletal: Chronic appearing bony deformities in the interphalangeal joints on both hands. At the distal interphalangeal joint and the right third digit there is moderate swelling with tenderness, darkened skin discoloration. There is purulent drainage dorsally from the largest area of swelling. Neurologic:  Normal speech and language. No gross focal neurologic deficits are appreciated.  Skin:  Swelling and purulent drainage from the right third digit  distally Psychiatric: Mood and affect are normal. Speech and behavior are normal.  ____________________________________________  ED COURSE:  Pertinent labs & imaging results that were available during my care of the patient were reviewed by me and considered in my medical decision making (see chart for details). Clinical Course   Patient resents to ER with distal right third digit swelling and purulent drainage. Findings concerning for hardware infection. We will assess with labs and imaging.  Procedures ____________________________________________   LABS (pertinent positives/negatives)  Labs Reviewed  CBC - Abnormal; Notable for the following:       Result Value   RBC 3.60 (*)    Hemoglobin 11.2 (*)    HCT 32.9 (*)    RDW 17.9 (*)    All other components within normal limits  COMPREHENSIVE METABOLIC PANEL - Abnormal; Notable for the following:    CO2 20 (*)    Glucose, Bld 117 (*)    BUN 23 (*)    Creatinine, Ser 2.04 (*)    GFR calc non Af Amer 22 (*)    GFR calc Af Amer 25 (*)    All other components within normal limits  AEROBIC/ANAEROBIC CULTURE (SURGICAL/DEEP WOUND)    RADIOLOGY Images were viewed by me  IMPRESSION: Changes about the third DIP joint suggestive of osteomyelitis and loosening of the screw.   ____________________________________________  FINAL ASSESSMENT AND PLAN  Osteomyelitis  Plan: Patient with labs and imaging as dictated above. I have discussed with orthopedics on-call Dr. Roland Rack who recommends oral antibiotics. I have obtained a wound culture, she has received her first dose of antibiotics here. She'll be seen in the office on Monday for reevaluation.   Earleen Newport, MD   Note: This dictation was prepared with Dragon dictation. Any transcriptional errors that result from this process are unintentional    Earleen Newport, MD 03/28/16 [phone]

## 2016-03-28 NOTE — ED Notes (Signed)
Report called to charge nurse at [facility] ED.  Patient will be going by private vehicle.

## 2016-03-28 NOTE — ED Provider Notes (Signed)
[facility]    CSN: [id number] Arrival date & time: 03/28/16  1238   History   Chief Complaint Chief Complaint  Patient presents with  . finger infection    right 3rd finger   HPI 82 year old female with a comp and get a past medical history presents with the above complaint.  Patient had a fusion of the DIP joint of the right third finger on 7/27. It appears to have failed. Her last x-ray revealed that she had loose hardware. Over the past 2 days she's noticed significant swelling and warmth around the joint. She's now had drainage from the area. No reports of systemic signs or symptoms. She states that her pain is severe. No known relieving factors. No other complaints or concerns at this time.  Past Medical History:  Diagnosis Date  . A-fib ([place])   . Anginal pain ([place])   . Arthritis   . Chronic renal insufficiency   . COPD (chronic obstructive pulmonary disease) ([place])   . Coronary artery disease   . Diverticulosis   . Gout   . Hyperlipidemia   . Hypertension   . Myocardial infarction   . Nephrolithiasis   . Peripheral vascular disease ([place])   . Stroke [facility])    There are no active problems to display for this patient.  Past Surgical History:  Procedure Laterality Date  . APPENDECTOMY    . BREAST SURGERY    . CARDIAC SURGERY    . CORONARY ARTERY BYPASS GRAFT    . DISTAL INTERPHALANGEAL JOINT FUSION Right 11/28/2015   Procedure: DISTAL INTERPHALANGEAL JOINT FUSION;  Surgeon: Hessie Knows, MD;  Location: [facility] ORS;  Service: Orthopedics;  Laterality: Right;  third finger  . KIDNEY STONE SURGERY      OB History    No data available     Home Medications    Prior to Admission medications   Medication Sig Start Date End Date Taking? Authorizing Provider  allopurinol (ZYLOPRIM) 100 MG tablet Take 100 mg by mouth daily.    Historical Provider, MD  amiodarone (PACERONE) 200 MG tablet Take 200 mg by mouth daily.    Historical Provider, MD  amLODipine  (NORVASC) 10 MG tablet Take 10 mg by mouth daily.    Historical Provider, MD  aspirin 81 MG tablet Take 81 mg by mouth daily.    Historical Provider, MD  furosemide (LASIX) 40 MG tablet Take 40 mg by mouth daily.    Historical Provider, MD  HYDROcodone-acetaminophen (NORCO) 5-325 MG tablet Take 1 tablet by mouth every 6 (six) hours as needed for moderate pain. 11/28/15   Hessie Knows, MD  metoprolol tartrate (LOPRESSOR) 25 MG tablet Take 50 mg by mouth 2 (two) times daily.     Historical Provider, MD  Omega-3 Fatty Acids (FISH OIL PO) Take 1 capsule by mouth daily.    Historical Provider, MD  pravastatin (PRAVACHOL) 20 MG tablet Take 80 mg by mouth daily.     Historical Provider, MD  VITAMIN E PO Take 2 tablets by mouth daily.    Historical Provider, MD  zolpidem (AMBIEN) 10 MG tablet Take 10 mg by mouth at bedtime as needed for sleep.    Historical Provider, MD   Family History History reviewed. No pertinent family history.  Social History Social History  Substance Use Topics  . Smoking status: Current Some Day Smoker    Types: Cigarettes  . Smokeless tobacco: Never Used  . Alcohol use No   Allergies   Ciprofloxacin hcl  and Codeine   Review of Systems Review of Systems  Constitutional: Negative.   Musculoskeletal: Positive for joint swelling.  Skin: Positive for wound.   Physical Exam Triage Vital Signs ED Triage Vitals  Enc Vitals Group     BP 03/28/16 1318 (!) 162/97     Pulse Rate 03/28/16 1318 81     Resp 03/28/16 1318 18     Temp 03/28/16 1318 97.3 F (36.3 C)     Temp Source 03/28/16 1318 Oral     SpO2 03/28/16 1318 100 %     Weight 03/28/16 1316 161 lb (73 kg)     Height --      Head Circumference --      Peak Flow --      Pain Score 03/28/16 1318 8     Pain Loc --      Pain Edu? --      Excl. in [place]? --    Updated Vital Signs BP (!) 162/97 (BP Location: Left Arm)   Pulse 81   Temp 97.3 F (36.3 C) (Oral)   Resp 18   Wt 161 lb (73 kg)   SpO2 100%   BMI  28.52 kg/m    Physical Exam  Constitutional: No distress.  Pulmonary/Chest: Effort normal.  Musculoskeletal:  Right third DIP - surrounding swelling, and warmth. Purulent drainage. Decreased range of motion.  Neurological: She is alert.  Psychiatric: She has a normal mood and affect.  Vitals reviewed.  UC Treatments / Results  Labs (all labs ordered are listed, but only abnormal results are displayed) Labs Reviewed - No data to display  EKG  EKG Interpretation None       Radiology No results found.  Procedures Procedures (including critical care time)  Medications Ordered in UC Medications - No data to display   Initial Impression / Assessment and Plan / UC Course  I have reviewed the triage vital signs and the nursing notes.  Pertinent labs & imaging results that were available during my care of the patient were reviewed by me and considered in my medical decision making (see chart for details).  Clinical Course   82 year old female presents with evidence of infection of her right third finger.  Sending to the ED for IV antibiotics and surgical consult.  Final Clinical Impressions(s) / UC Diagnoses   Final diagnoses:  Finger infection   New Prescriptions New Prescriptions   No medications on file     Coral Spikes, DO 03/28/16 1351

## 2016-03-28 NOTE — Discharge Instructions (Signed)
Head to the ER.   You need antibiotics and a surgical evaluation.   Take care  Dr. Lacinda Axon

## 2016-03-28 NOTE — ED Triage Notes (Signed)
Patient c/o pain and swelling in her right 3rd finger that started 3 days ago.  Patient had surgery on her right 3rd finger back in March or April of this year.

## 2016-03-28 NOTE — ED Triage Notes (Signed)
States had surgery to 3rd digit R hand March of this year. States has pin in that finger. States finger has been dark colored since surgery but increased swelling and drainage started 2 days ago.

## 2016-03-31 ENCOUNTER — Telehealth: Payer: Self-pay | Admitting: Emergency Medicine

## 2016-03-31 ENCOUNTER — Telehealth: Payer: Self-pay

## 2016-03-31 NOTE — Telephone Encounter (Signed)
Courtesy call back completed today for patients recent visit at [facility]. Patient did not answer, left message on voicemail to call back with any questions or concerns.   

## 2016-04-01 DIAGNOSIS — M869 Osteomyelitis, unspecified: Secondary | ICD-10-CM | POA: Diagnosis not present

## 2016-04-01 DIAGNOSIS — M898X9 Other specified disorders of bone, unspecified site: Secondary | ICD-10-CM | POA: Diagnosis not present

## 2016-04-02 ENCOUNTER — Ambulatory Visit: Payer: PPO | Admitting: Anesthesiology

## 2016-04-02 ENCOUNTER — Encounter
Admission: RE | Disposition: A | Discharge status: Home / Self Care | Payer: Self-pay | Source: Ambulatory Visit | Attending: Orthopedic Surgery

## 2016-04-02 ENCOUNTER — Encounter: Payer: Self-pay | Admitting: *Deleted

## 2016-04-02 ENCOUNTER — Ambulatory Visit
Admission: RE | Admit: 2016-04-02 | Discharge: 2016-04-02 | Disposition: A | Discharge status: Home / Self Care | Payer: PPO | Source: Ambulatory Visit | Attending: Orthopedic Surgery | Admitting: Orthopedic Surgery

## 2016-04-02 DIAGNOSIS — Z79899 Other long term (current) drug therapy: Secondary | ICD-10-CM | POA: Insufficient documentation

## 2016-04-02 DIAGNOSIS — I25810 Atherosclerosis of coronary artery bypass graft(s) without angina pectoris: Secondary | ICD-10-CM | POA: Diagnosis not present

## 2016-04-02 DIAGNOSIS — J449 Chronic obstructive pulmonary disease, unspecified: Secondary | ICD-10-CM | POA: Insufficient documentation

## 2016-04-02 DIAGNOSIS — Z87442 Personal history of urinary calculi: Secondary | ICD-10-CM | POA: Insufficient documentation

## 2016-04-02 DIAGNOSIS — I4891 Unspecified atrial fibrillation: Secondary | ICD-10-CM | POA: Diagnosis not present

## 2016-04-02 DIAGNOSIS — N189 Chronic kidney disease, unspecified: Secondary | ICD-10-CM | POA: Insufficient documentation

## 2016-04-02 DIAGNOSIS — M868X4 Other osteomyelitis, hand: Secondary | ICD-10-CM | POA: Diagnosis not present

## 2016-04-02 DIAGNOSIS — Z8673 Personal history of transient ischemic attack (TIA), and cerebral infarction without residual deficits: Secondary | ICD-10-CM | POA: Insufficient documentation

## 2016-04-02 DIAGNOSIS — M869 Osteomyelitis, unspecified: Secondary | ICD-10-CM | POA: Diagnosis not present

## 2016-04-02 DIAGNOSIS — I25119 Atherosclerotic heart disease of native coronary artery with unspecified angina pectoris: Secondary | ICD-10-CM | POA: Diagnosis not present

## 2016-04-02 DIAGNOSIS — M86141 Other acute osteomyelitis, right hand: Secondary | ICD-10-CM | POA: Diagnosis not present

## 2016-04-02 DIAGNOSIS — Z7982 Long term (current) use of aspirin: Secondary | ICD-10-CM | POA: Diagnosis not present

## 2016-04-02 DIAGNOSIS — I252 Old myocardial infarction: Secondary | ICD-10-CM | POA: Insufficient documentation

## 2016-04-02 DIAGNOSIS — I739 Peripheral vascular disease, unspecified: Secondary | ICD-10-CM | POA: Insufficient documentation

## 2016-04-02 DIAGNOSIS — Z888 Allergy status to other drugs, medicaments and biological substances status: Secondary | ICD-10-CM | POA: Insufficient documentation

## 2016-04-02 DIAGNOSIS — I129 Hypertensive chronic kidney disease with stage 1 through stage 4 chronic kidney disease, or unspecified chronic kidney disease: Secondary | ICD-10-CM | POA: Diagnosis not present

## 2016-04-02 DIAGNOSIS — Z885 Allergy status to narcotic agent status: Secondary | ICD-10-CM | POA: Insufficient documentation

## 2016-04-02 DIAGNOSIS — M1A9XX1 Chronic gout, unspecified, with tophus (tophi): Secondary | ICD-10-CM | POA: Insufficient documentation

## 2016-04-02 DIAGNOSIS — I10 Essential (primary) hypertension: Secondary | ICD-10-CM | POA: Diagnosis not present

## 2016-04-02 DIAGNOSIS — K5790 Diverticulosis of intestine, part unspecified, without perforation or abscess without bleeding: Secondary | ICD-10-CM | POA: Insufficient documentation

## 2016-04-02 DIAGNOSIS — M899 Disorder of bone, unspecified: Secondary | ICD-10-CM | POA: Diagnosis not present

## 2016-04-02 DIAGNOSIS — E785 Hyperlipidemia, unspecified: Secondary | ICD-10-CM | POA: Insufficient documentation

## 2016-04-02 DIAGNOSIS — Z951 Presence of aortocoronary bypass graft: Secondary | ICD-10-CM | POA: Insufficient documentation

## 2016-04-02 HISTORY — PX: AMPUTATION: SHX166

## 2016-04-02 SURGERY — AMPUTATION DIGIT
Anesthesia: General | Site: Middle Finger | Laterality: Right | Wound class: Dirty or Infected

## 2016-04-02 MED ORDER — CEFAZOLIN SODIUM-DEXTROSE 2-4 GM/100ML-% IV SOLN
2.0000 g | Freq: Once | INTRAVENOUS | Status: AC
  Administered 2016-04-02: 2 g via INTRAVENOUS

## 2016-04-02 MED ORDER — LIDOCAINE HCL (PF) 1 % IJ SOLN
INTRAMUSCULAR | Status: AC
  Filled 2016-04-02: qty 30

## 2016-04-02 MED ORDER — PROPOFOL 10 MG/ML IV BOLUS
INTRAVENOUS | Status: DC | PRN
  Administered 2016-04-02: 100 mg via INTRAVENOUS

## 2016-04-02 MED ORDER — FENTANYL CITRATE (PF) 100 MCG/2ML IJ SOLN: 25.0000 ug | INTRAMUSCULAR | Status: DC | PRN

## 2016-04-02 MED ORDER — BUPIVACAINE HCL (PF) 0.5 % IJ SOLN
INTRAMUSCULAR | Status: DC | PRN
  Administered 2016-04-02: 20 mL

## 2016-04-02 MED ORDER — OXYCODONE HCL 5 MG PO TABS: 5.0000 mg | ORAL_TABLET | Freq: Once | ORAL | Status: DC | PRN

## 2016-04-02 MED ORDER — BUPIVACAINE HCL (PF) 0.5 % IJ SOLN
INTRAMUSCULAR | Status: AC
Start: 2016-04-02 — End: 2016-04-02
  Filled 2016-04-02: qty 30

## 2016-04-02 MED ORDER — OXYCODONE HCL 5 MG/5ML PO SOLN: 5.0000 mg | Freq: Once | ORAL | Status: DC | PRN

## 2016-04-02 MED ORDER — FAMOTIDINE 20 MG PO TABS
ORAL_TABLET | ORAL | Status: AC
  Filled 2016-04-02: qty 1

## 2016-04-02 MED ORDER — MEPERIDINE HCL 25 MG/ML IJ SOLN: 6.2500 mg | INTRAMUSCULAR | Status: DC | PRN

## 2016-04-02 MED ORDER — CEFAZOLIN SODIUM-DEXTROSE 2-4 GM/100ML-% IV SOLN
INTRAVENOUS | Status: AC
  Filled 2016-04-02: qty 100

## 2016-04-02 MED ORDER — ONDANSETRON HCL 4 MG/2ML IJ SOLN
INTRAMUSCULAR | Status: DC | PRN
  Administered 2016-04-02: 4 mg via INTRAVENOUS

## 2016-04-02 MED ORDER — SODIUM CHLORIDE 0.9 % IV SOLN
INTRAVENOUS | Status: DC
  Administered 2016-04-02: 13:00:00 via INTRAVENOUS

## 2016-04-02 MED ORDER — MIDAZOLAM HCL 5 MG/5ML IJ SOLN
INTRAMUSCULAR | Status: DC | PRN
  Administered 2016-04-02: 1 mg via INTRAVENOUS

## 2016-04-02 MED ORDER — PROMETHAZINE HCL 25 MG/ML IJ SOLN: 6.2500 mg | INTRAMUSCULAR | Status: DC | PRN

## 2016-04-02 MED ORDER — KETAMINE HCL 50 MG/ML IJ SOLN
INTRAMUSCULAR | Status: DC | PRN
  Administered 2016-04-02: 25 mg via INTRAVENOUS

## 2016-04-02 MED ORDER — FAMOTIDINE 20 MG PO TABS
20.0000 mg | ORAL_TABLET | Freq: Once | ORAL | Status: AC
  Administered 2016-04-02: 20 mg via ORAL

## 2016-04-02 SURGICAL SUPPLY — 19 items
BNDG GAUZE 1X2.1 STRL (MISCELLANEOUS) ×3 IMPLANT
CANISTER SUCT 1200ML W/VALVE (MISCELLANEOUS) ×3 IMPLANT
CHLORAPREP W/TINT 26ML (MISCELLANEOUS) ×3 IMPLANT
CUFF TOURN 18 STER (MISCELLANEOUS) ×3 IMPLANT
ELECT CAUTERY BLADE 6.4 (BLADE) ×3 IMPLANT
GAUZE PETRO XEROFOAM 1X8 (MISCELLANEOUS) ×3 IMPLANT
GAUZE SPONGE 4X4 12PLY STRL (GAUZE/BANDAGES/DRESSINGS) ×3 IMPLANT
GLOVE BIOGEL PI IND STRL 9 (GLOVE) ×1 IMPLANT
GLOVE BIOGEL PI INDICATOR 9 (GLOVE) ×2
GLOVE SURG SYN 9.0  PF PI (GLOVE) ×2
GLOVE SURG SYN 9.0 PF PI (GLOVE) ×1 IMPLANT
GOWN SRG 2XL LVL 4 RGLN SLV (GOWNS) ×1 IMPLANT
GOWN STRL NON-REIN 2XL LVL4 (GOWNS) ×2
GOWN STRL REUS W/ TWL LRG LVL3 (GOWN DISPOSABLE) ×1 IMPLANT
GOWN STRL REUS W/TWL LRG LVL3 (GOWN DISPOSABLE) ×2
KIT RM TURNOVER STRD PROC AR (KITS) ×3 IMPLANT
NEEDLE HYPO 25X1 1.5 SAFETY (NEEDLE) ×3 IMPLANT
PACK EXTREMITY ARMC (MISCELLANEOUS) ×3 IMPLANT
STOCKINETTE STRL 4IN 9604848 (GAUZE/BANDAGES/DRESSINGS) ×3 IMPLANT

## 2016-04-02 NOTE — Transfer of Care (Signed)
Immediate Anesthesia Transfer of Care Note  Patient: Kathryn Cain  Procedure(s) Performed: Procedure(s): AMPUTATION FINGER TIP (Right)  Patient Location: PACU  Anesthesia Type:General  Level of Consciousness: sedated  Airway & Oxygen Therapy: Patient Spontanous Breathing and Patient connected to face mask oxygen  Post-op Assessment: Report given to RN and Post -op Vital signs reviewed and stable  Post vital signs: Reviewed  Last Vitals:  Vitals:   04/02/16 1235 04/02/16 1458  BP: 124/70 (!) 148/77  Pulse: 71 63  Resp: 18 12  Temp: 36.8 C     Last Pain:  Vitals:   04/02/16 1235  TempSrc: Oral  PainSc: 0-No pain         Complications: No apparent anesthesia complications

## 2016-04-02 NOTE — Op Note (Signed)
04/02/2016  3:04 PM  PATIENT:  Kathryn Cain  82 y.o. female  PRE-OPERATIVE DIAGNOSIS:  FINGER OSTEOMYELITIS,RIGHT MIDDLE FINGER  POST-OPERATIVE DIAGNOSIS:  FINGER OSTEOMYELITIS,RIGHT MIDDLE FINGER  PROCEDURE:  Procedure(s): AMPUTATION FINGER TIP (Right) middle finger through PIP joint  SURGEON: Laurene Footman, MD  ASSISTANTS: None  ANESTHESIA:   general  EBL:  Total I/O In: 300 [I.V.:300] Out: 0   BLOOD ADMINISTERED:none  DRAINS: none   LOCAL MEDICATIONS USED:  MARCAINE     SPECIMEN:  Source of Specimen:  Fingertip, right middle finger  DISPOSITION OF SPECIMEN:  PATHOLOGY  COUNTS:  YES  TOURNIQUET:   15 minutes at 250 mm  mercury  IMPLANTS: None  DICTATION: .Dragon Dictation patient brought the operating room and after adequate general anesthesia was obtained the right arm prepped draped in sterile fashion. After patient identification and timeout procedures were completed 10 cc of half percent Sensorcaine without epinephrine was infiltrated on either side of the at the base of the third metacarpal to get a digital block for postop analgesia. The tourniquet was then raised and the prior screw was removed along with the distal phalanx with the dorsal incision over the mid portion of the middle phalanx going along the mid axial line on the ulnar and radial sides and removing the entire nail. The middle phalanx was debrided where there was some residual infection where the screw had been placed inside the medullary canal after adequate debridement with a rongeur of the bone and getting smooth edges the flap was thoroughly irrigated and then with no evidence of current infection the flap was brought over dorsally and sutured in place with multiple 4-0 nylon simple interrupted skin sutures after this was done and there was good coverage the wound was dressed with Xeroform followed by 4 x 4's 1 inch Kling and additional 4 x 4's and a bias wrap wrapping the entire hand.  Tourniquet was let down with close the case  PLAN OF CARE: Discharge to home after PACU  PATIENT DISPOSITION:  PACU - hemodynamically stable.

## 2016-04-02 NOTE — Anesthesia Preprocedure Evaluation (Signed)
Anesthesia Evaluation  Patient identified by MRN, date of birth, ID band Patient awake    Reviewed: Allergy & Precautions, NPO status , Patient's Chart, lab work & pertinent test results  History of Anesthesia Complications Negative for: history of anesthetic complications  Airway Mallampati: II  TM Distance: >3 FB Neck ROM: Full    Dental  (+) Upper Dentures, Lower Dentures   Pulmonary neg sleep apnea, COPD, Current Smoker,    breath sounds clear to auscultation- rhonchi (-) wheezing      Cardiovascular hypertension, + CAD, + Past MI and + CABG  + dysrhythmias (paroxysmal) Atrial Fibrillation  Rhythm:Regular Rate:Normal - Systolic murmurs and - Diastolic murmurs    Neuro/Psych CVA, No Residual Symptoms negative psych ROS   GI/Hepatic negative GI ROS, Neg liver ROS,   Endo/Other  negative endocrine ROSneg diabetes  Renal/GU CRFRenal disease     Musculoskeletal  (+) Arthritis ,   Abdominal (+) - obese,   Peds  Hematology negative hematology ROS (+)   Anesthesia Other Findings Past Medical History: No date: A-fib (HCC) No date: Anginal pain (HCC) No date: Arthritis No date: Chronic renal insufficiency No date: COPD (chronic obstructive pulmonary disease) (* No date: Coronary artery disease No date: Diverticulosis No date: Gout No date: Hyperlipidemia No date: Hypertension No date: Myocardial infarction No date: Nephrolithiasis No date: Peripheral vascular disease (HCC) No date: Stroke [facility])   Reproductive/Obstetrics                             Anesthesia Physical Anesthesia Plan  ASA: III  Anesthesia Plan: General   Post-op Pain Management:    Induction: Intravenous  Airway Management Planned: LMA  Additional Equipment:   Intra-op Plan:   Post-operative Plan:   Informed Consent: I have reviewed the patients History and Physical, chart, labs and discussed the  procedure including the risks, benefits and alternatives for the proposed anesthesia with the patient or authorized representative who has indicated his/her understanding and acceptance.   Dental advisory given  Plan Discussed with: CRNA and Anesthesiologist  Anesthesia Plan Comments:         Anesthesia Quick Evaluation

## 2016-04-02 NOTE — H&P (Signed)
Reviewed paper H+P, will be scanned into chart. No changes noted.  

## 2016-04-02 NOTE — Anesthesia Procedure Notes (Signed)
Procedure Name: LMA Insertion Performed by: [facility] Pre-anesthesia Checklist: Patient identified, Patient being monitored, Timeout performed, Emergency Drugs available and Suction available Patient Re-evaluated:Patient Re-evaluated prior to inductionOxygen Delivery Method: Circle system utilized Preoxygenation: Pre-oxygenation with 100% oxygen Intubation Type: IV induction Ventilation: Mask ventilation without difficulty LMA: LMA inserted LMA Size: 3.0 Tube type: Oral Number of attempts: 1 Placement Confirmation: positive ETCO2 and breath sounds checked- equal and bilateral Tube secured with: Tape Dental Injury: Teeth and Oropharynx as per pre-operative assessment      

## 2016-04-02 NOTE — Discharge Instructions (Addendum)
AMBULATORY SURGERY  DISCHARGE INSTRUCTIONS   1) The drugs that you were given will stay in your system until tomorrow so for the next 24 hours you should not:  A) Drive an automobile B) Make any legal decisions C) Drink any alcoholic beverage   2) You may resume regular meals tomorrow.  Today it is better to start with liquids and gradually work up to solid foods.  You may eat anything you prefer, but it is better to start with liquids, then soup and crackers, and gradually work up to solid foods.   3) Please notify your doctor immediately if you have any unusual bleeding, trouble breathing, redness and pain at the surgery site, drainage, fever, or pain not relieved by medication.    4) Additional Instructions:        Please contact your physician with any problems or Same Day Surgery at [phone], Monday through Friday 6 am to 4 pm, or [place] at [facility] number at [phone].   Keep dressing clean and dry. Pain medicine at home as usual and antibiotic as previously given.  Call if having problems.

## 2016-04-03 LAB — AEROBIC/ANAEROBIC CULTURE W GRAM STAIN (SURGICAL/DEEP WOUND)

## 2016-04-03 LAB — AEROBIC/ANAEROBIC CULTURE (SURGICAL/DEEP WOUND): CULTURE: NO GROWTH

## 2016-04-03 NOTE — Anesthesia Postprocedure Evaluation (Signed)
Anesthesia Post Note  Patient: Kathryn Cain  Procedure(s) Performed: Procedure(s) (LRB): AMPUTATION FINGER TIP (Right)  Patient location during evaluation: PACU Anesthesia Type: General Level of consciousness: awake and alert and oriented Pain management: pain level controlled Vital Signs Assessment: post-procedure vital signs reviewed and stable Respiratory status: spontaneous breathing, nonlabored ventilation and respiratory function stable Cardiovascular status: blood pressure returned to baseline and stable Postop Assessment: no signs of nausea or vomiting Anesthetic complications: no    Last Vitals:  Vitals:   04/02/16 1552 04/02/16 1615  BP: (!) 154/74 (!) 143/73  Pulse: 82   Resp: 15   Temp:      Last Pain:  Vitals:   04/02/16 1458  TempSrc:   PainSc: Asleep                 Stephie Xu

## 2016-04-06 LAB — SURGICAL PATHOLOGY

## 2016-05-25 DIAGNOSIS — R0981 Nasal congestion: Secondary | ICD-10-CM | POA: Diagnosis not present

## 2016-05-25 DIAGNOSIS — I10 Essential (primary) hypertension: Secondary | ICD-10-CM | POA: Diagnosis not present

## 2016-05-25 DIAGNOSIS — E7800 Pure hypercholesterolemia, unspecified: Secondary | ICD-10-CM | POA: Diagnosis not present

## 2016-06-30 DIAGNOSIS — L989 Disorder of the skin and subcutaneous tissue, unspecified: Secondary | ICD-10-CM | POA: Diagnosis not present

## 2016-06-30 DIAGNOSIS — F4321 Adjustment disorder with depressed mood: Secondary | ICD-10-CM | POA: Diagnosis not present

## 2016-08-24 ENCOUNTER — Encounter: Payer: Self-pay | Admitting: Orthopedic Surgery

## 2016-08-24 DIAGNOSIS — F17210 Nicotine dependence, cigarettes, uncomplicated: Secondary | ICD-10-CM | POA: Diagnosis not present

## 2016-08-24 DIAGNOSIS — F418 Other specified anxiety disorders: Secondary | ICD-10-CM | POA: Diagnosis not present

## 2016-08-24 DIAGNOSIS — J301 Allergic rhinitis due to pollen: Secondary | ICD-10-CM | POA: Diagnosis not present

## 2016-08-24 DIAGNOSIS — R946 Abnormal results of thyroid function studies: Secondary | ICD-10-CM | POA: Diagnosis not present

## 2016-08-24 DIAGNOSIS — I10 Essential (primary) hypertension: Secondary | ICD-10-CM | POA: Diagnosis not present

## 2016-08-24 DIAGNOSIS — F5104 Psychophysiologic insomnia: Secondary | ICD-10-CM | POA: Diagnosis not present

## 2016-08-24 DIAGNOSIS — R062 Wheezing: Secondary | ICD-10-CM | POA: Diagnosis not present

## 2016-08-24 DIAGNOSIS — R0981 Nasal congestion: Secondary | ICD-10-CM | POA: Diagnosis not present

## 2016-08-24 DIAGNOSIS — E7800 Pure hypercholesterolemia, unspecified: Secondary | ICD-10-CM | POA: Diagnosis not present

## 2016-08-24 DIAGNOSIS — E039 Hypothyroidism, unspecified: Secondary | ICD-10-CM | POA: Diagnosis not present

## 2016-09-07 ENCOUNTER — Emergency Department
Admission: EM | Admit: 2016-09-07 | Discharge: 2016-09-07 | Disposition: A | Discharge status: Home / Self Care | Payer: PPO | Attending: Emergency Medicine | Admitting: Emergency Medicine

## 2016-09-07 ENCOUNTER — Encounter: Payer: Self-pay | Admitting: Emergency Medicine

## 2016-09-07 DIAGNOSIS — R0789 Other chest pain: Secondary | ICD-10-CM | POA: Diagnosis not present

## 2016-09-07 DIAGNOSIS — J449 Chronic obstructive pulmonary disease, unspecified: Secondary | ICD-10-CM | POA: Diagnosis not present

## 2016-09-07 DIAGNOSIS — Z79899 Other long term (current) drug therapy: Secondary | ICD-10-CM | POA: Insufficient documentation

## 2016-09-07 DIAGNOSIS — M79606 Pain in leg, unspecified: Secondary | ICD-10-CM | POA: Diagnosis not present

## 2016-09-07 DIAGNOSIS — R9431 Abnormal electrocardiogram [ECG] [EKG]: Secondary | ICD-10-CM | POA: Diagnosis not present

## 2016-09-07 DIAGNOSIS — I2510 Atherosclerotic heart disease of native coronary artery without angina pectoris: Secondary | ICD-10-CM | POA: Insufficient documentation

## 2016-09-07 DIAGNOSIS — E860 Dehydration: Secondary | ICD-10-CM

## 2016-09-07 DIAGNOSIS — F17210 Nicotine dependence, cigarettes, uncomplicated: Secondary | ICD-10-CM | POA: Diagnosis not present

## 2016-09-07 DIAGNOSIS — M6281 Muscle weakness (generalized): Secondary | ICD-10-CM | POA: Insufficient documentation

## 2016-09-07 DIAGNOSIS — R29898 Other symptoms and signs involving the musculoskeletal system: Secondary | ICD-10-CM

## 2016-09-07 DIAGNOSIS — Z8673 Personal history of transient ischemic attack (TIA), and cerebral infarction without residual deficits: Secondary | ICD-10-CM | POA: Diagnosis not present

## 2016-09-07 DIAGNOSIS — R531 Weakness: Secondary | ICD-10-CM | POA: Diagnosis not present

## 2016-09-07 DIAGNOSIS — I10 Essential (primary) hypertension: Secondary | ICD-10-CM | POA: Diagnosis not present

## 2016-09-07 LAB — URINALYSIS, COMPLETE (UACMP) WITH MICROSCOPIC
Bilirubin Urine: NEGATIVE
GLUCOSE, UA: NEGATIVE mg/dL
Hgb urine dipstick: NEGATIVE
KETONES UR: NEGATIVE mg/dL
LEUKOCYTES UA: NEGATIVE
Nitrite: NEGATIVE
Protein, ur: 100 mg/dL — AB
Specific Gravity, Urine: 1.011 (ref 1.005–1.030)
pH: 5 (ref 5.0–8.0)

## 2016-09-07 LAB — COMPREHENSIVE METABOLIC PANEL
ALBUMIN: 4 g/dL (ref 3.5–5.0)
ALT: 14 U/L (ref 14–54)
ANION GAP: 9 (ref 5–15)
AST: 21 U/L (ref 15–41)
Alkaline Phosphatase: 152 U/L — ABNORMAL HIGH (ref 38–126)
BUN: 42 mg/dL — ABNORMAL HIGH (ref 6–20)
CO2: 22 mmol/L (ref 22–32)
Calcium: 9 mg/dL (ref 8.9–10.3)
Chloride: 106 mmol/L (ref 101–111)
Creatinine, Ser: 2.71 mg/dL — ABNORMAL HIGH (ref 0.44–1.00)
GFR calc Af Amer: 18 mL/min — ABNORMAL LOW (ref >60)
GFR calc non Af Amer: 16 mL/min — ABNORMAL LOW (ref >60)
GLUCOSE: 103 mg/dL — AB (ref 65–99)
POTASSIUM: 5 mmol/L (ref 3.5–5.1)
SODIUM: 137 mmol/L (ref 135–145)
Total Bilirubin: 0.5 mg/dL (ref 0.3–1.2)
Total Protein: 7.6 g/dL (ref 6.5–8.1)

## 2016-09-07 LAB — CBC
HCT: 30.4 % — ABNORMAL LOW (ref 35.0–47.0)
Hemoglobin: 10.4 g/dL — ABNORMAL LOW (ref 12.0–16.0)
MCH: 32.6 pg (ref 26.0–34.0)
MCHC: 34.2 g/dL (ref 32.0–36.0)
MCV: 95.2 fL (ref 80.0–100.0)
PLATELETS: 182 10*3/uL (ref 150–440)
RBC: 3.2 MIL/uL — ABNORMAL LOW (ref 3.80–5.20)
RDW: 14.6 % — AB (ref 11.5–14.5)
WBC: 5.7 10*3/uL (ref 3.6–11.0)

## 2016-09-07 LAB — TROPONIN I
Troponin I: 0.03 ng/mL (ref <0.03)
Troponin I: <0.03 ng/mL (ref <0.03)

## 2016-09-07 MED ORDER — SODIUM CHLORIDE 0.9 % IV BOLUS (SEPSIS)
1000.0000 mL | Freq: Once | INTRAVENOUS | Status: AC
  Administered 2016-09-07: 1000 mL via INTRAVENOUS

## 2016-09-07 NOTE — ED Notes (Addendum)
Charge nurse notified of pt's troponin; EKG is not charged but copy is on paper chart

## 2016-09-07 NOTE — Discharge Instructions (Signed)
Please drink plenty of fluid to stay well-hydrated. Make a follow-up appointment with your primary care physician.  Return to the emergency department if you develop severe pain, lightheadedness, numbness tingling or weakness, chest pain, shortness of breath, or any other symptoms concerning to you.

## 2016-09-07 NOTE — ED Notes (Signed)
Patient arrives to the ED via EMS from [facility].  Patient was being seen for leg weakness today.  EMS states physician was concerned because an EKG in the past showed T-wave inversion, but EKG today did not show T-wave inversion.

## 2016-09-07 NOTE — ED Notes (Signed)
Pt states she is unable to void at this time - will start IV fluids and then see if pt needs to void after infusion

## 2016-09-07 NOTE — ED Notes (Signed)
Orthostatic vitals Laying BP 176/87 Pulse 74 Sitting BP 184/89 Pulse 80 Standing BP 211/118 Pulse 92

## 2016-09-07 NOTE — ED Provider Notes (Signed)
[facility] Emergency Department Provider Note  ____________________________________________  Time seen: Approximately 9:09 PM  I have reviewed the triage vital signs and the nursing notes.   HISTORY  Chief Complaint Weakness    HPI Kathryn Cain is a 83 y.o. female w/ a hx of afib, COPD, CRI, CAD s/p MI, CVA presenting for bilateral leg weakness.  The pt reports she has not been drinking enough water and today she felt generalized weakness and like it was difficult to walk.  Her mouth has felt dry.  She denies any focal numbness, tingling, weakness, she denies CP, SOB, palpitations, lightheadedness or syncope.  No urinary sx's, n/v/d, fevers or chills.  No recent changes in her medications.  She went to UC and was sent here for tachycardia, but states, "I think I was just nervous."   Past Medical History:  Diagnosis Date  . A-fib ([place])   . Anginal pain ([place])   . Arthritis   . Chronic renal insufficiency   . COPD (chronic obstructive pulmonary disease) ([place])   . Coronary artery disease   . Diverticulosis   . Gout   . Hyperlipidemia   . Hypertension   . Myocardial infarction ([place])   . Nephrolithiasis   . Peripheral vascular disease ([place])   . Stroke [facility])     There are no active problems to display for this patient.   Past Surgical History:  Procedure Laterality Date  . AMPUTATION Right 04/02/2016   Procedure: AMPUTATION FINGER TIP;  Surgeon: Hessie Knows, MD;  Location: [facility] ORS;  Service: Orthopedics;  Laterality: Right;  . APPENDECTOMY    . BREAST SURGERY Right   . CARDIAC SURGERY    . CORONARY ARTERY BYPASS GRAFT    . DISTAL INTERPHALANGEAL JOINT FUSION Right 11/28/2015   Procedure: DISTAL INTERPHALANGEAL JOINT FUSION;  Surgeon: Hessie Knows, MD;  Location: [facility] ORS;  Service: Orthopedics;  Laterality: Right;  third finger  . KIDNEY STONE SURGERY      Current Outpatient Rx  . Order #: [id number] Class: Historical Med  . Order #:  [id number] Class: Historical Med  . Order #: [id number] Class: Historical Med  . Order #: [id number] Class: Historical Med  . Order #: [id number] Class: Print  . Order #: [id number] Class: Historical Med  . Order #: [id number] Class: Historical Med  . Order #: [id number] Class: Print  . Order #: [id number] Class: Historical Med  . Order #: [id number] Class: Historical Med  . Order #: [id number] Class: Historical Med  . Order #: [id number] Class: Print  . Order #: [id number] Class: Historical Med  . Order #: [id number] Class: Historical Med  . Order #: [id number] Class: Historical Med    Allergies Ciprofloxacin hcl and Codeine  No family history on file.  Social History Social History  Substance Use Topics  . Smoking status: Current Some Day Smoker    Packs/day: 0.50    Types: Cigarettes  . Smokeless tobacco: Never Used  . Alcohol use No    Review of Systems Constitutional: No fever/chills. Generalized weakness.  No lightheadedness or syncope. Eyes: No visual changes. No blurred or double vision. ENT: No sore throat. No congestion or rhinorrhea. Cardiovascular: Denies chest pain. Denies palpitations. Respiratory: Denies shortness of breath.  No cough. Gastrointestinal: No abdominal pain.  No nausea, no vomiting.  No diarrhea.  No constipation. Genitourinary: Negative for dysuria. Musculoskeletal: Negative for back pain. Skin: Negative for rash. Neurological: Negative for headaches. No focal numbness, tingling. Positive bilateral LE weakness.  10-point ROS otherwise negative.  ____________________________________________   PHYSICAL EXAM:  VITAL SIGNS:  ED Triage Vitals  Enc Vitals Group     BP 09/07/16 1808 (!) 146/88     Pulse Rate 09/07/16 1808 92     Resp 09/07/16 1808 20     Temp 09/07/16 1808 99 F (37.2 C)     Temp Source 09/07/16 1808 Oral     SpO2 09/07/16 1808 99 %     Weight 09/07/16 1808 134 lb (60.8 kg)     Height 09/07/16 1808 5\' 4"  (1.626 m)     Head Circumference --       Peak Flow --      Pain Score 09/07/16 2049 0     Pain Loc --      Pain Edu? --      Excl. in [place]? --     Constitutional: Alert and oriented. Chronically ill appearing and in no acute distress. Answers questions appropriately. Eyes: Conjunctivae are normal.  EOMI. No scleral icterus. Head: Atraumatic. Nose: No congestion/rhinnorhea. Mouth/Throat: Mucous membranes are moist.  Neck: No stridor.  Supple.   Cardiovascular: Normal rate, regular rhythm. No murmurs, rubs or gallops.  Respiratory: Normal respiratory effort.  No accessory muscle use or retractions. Lungs CTAB.  No wheezes, rales or ronchi. Gastrointestinal: Soft, nontender and nondistended.  No guarding or rebound.  No peritoneal signs. Musculoskeletal: No LE edema.  Neurologic:  A&Ox3.  Speech is clear.  Face and smile are symmetric.  EOMI. PERRLA. 5 out of 5 bilateral grip strength, biceps and triceps. 5 out of 5 bilateral hip flexor strength, dorsiflexion and plantarflexion. Skin:  Skin is warm, dry and intact. No rash noted. Psychiatric: Mood and affect are normal. Speech and behavior are normal.  Normal judgement.  ____________________________________________   LABS (all labs ordered are listed, but only abnormal results are displayed)  Labs Reviewed  CBC - Abnormal; Notable for the following:       Result Value   RBC 3.20 (*)    Hemoglobin 10.4 (*)    HCT 30.4 (*)    RDW 14.6 (*)    All other components within normal limits  TROPONIN I - Abnormal; Notable for the following:    Troponin I 0.03 (*)    All other components within normal limits  COMPREHENSIVE METABOLIC PANEL - Abnormal; Notable for the following:    Glucose, Bld 103 (*)    BUN 42 (*)    Creatinine, Ser 2.71 (*)    Alkaline Phosphatase 152 (*)    GFR calc non Af Amer 16 (*)    GFR calc Af Amer 18 (*)    All other components within normal limits  URINALYSIS, COMPLETE (UACMP) WITH MICROSCOPIC - Abnormal; Notable for the following:    Color, Urine  YELLOW (*)    APPearance CLEAR (*)    Protein, ur 100 (*)    Bacteria, UA RARE (*)    Squamous Epithelial / LPF 0-5 (*)    All other components within normal limits  TROPONIN I   ____________________________________________  EKG  ED ECG REPORT I, Eula Listen, the attending physician, personally viewed and interpreted this ECG.   Date: 09/07/2016  EKG Time: 1820  Rate: 92   Rhythm: normal sinus rhythm  Axis: normal  Intervals:none  ST&T Change: No STEMI.  ____________________________________________  RADIOLOGY  No results found.  ____________________________________________   PROCEDURES  Procedure(s) performed: None  Procedures  Critical Care performed: No ____________________________________________   INITIAL IMPRESSION / ASSESSMENT AND PLAN / ED COURSE  Pertinent labs & imaging results that  were available during my care of the patient were reviewed by me and considered in my medical decision making (see chart for details).  83 y.o. AAF presenting from UC for tachycardia, recent generalized weakness in the setting of not drinking enough fluids.  The patient does have multiple comorbidities, but the most likely etiology of her symptoms is dehydration and symptomatic hypovolemia.  Her creatinine today is 2.71, which is baseline from our last labs in 2015.  Her BUN is elevated, which supports dehydration.  She has a baseline anemia which is unchanged. Her troponin of 0.03 will need to be repeated, but is likely due to her chronic renal insufficiency given that she is completely asymptomatic from a chest pain standpoint, that her EKG does not show any ischemic changes. We will repeat this test for further confirmation. I'll plan to hydrate the patient, and reevaluate her for final disposition.  ----------------------------------------- 10:37 PM on 09/07/2016 -----------------------------------------  The patient is not orthostatic on exam. Her second  troponin is negative. At this time, the patient is feeling better and will be discharged home with close PMD follow-up. She lives with her son, who understands turn precautions as well as follow-up instructions.  ____________________________________________  FINAL CLINICAL IMPRESSION(S) / ED DIAGNOSES  Final diagnoses:  Dehydration  Bilateral leg weakness         NEW MEDICATIONS STARTED DURING THIS VISIT:  New Prescriptions   No medications on file      Eula Listen, MD 09/07/16 2238

## 2016-09-07 NOTE — ED Triage Notes (Signed)
General weakness since yesterday, denies chest pain or SOB. Was at [facility] office states they told her to come in due to abnormal EKG.

## 2016-09-07 NOTE — ED Notes (Signed)
Charge nurse called again regarding exam room needed for pt

## 2016-09-07 NOTE — ED Notes (Signed)
Pt reports that she has been feeling dizzy and had decreased energy since yesterday - she states that she mixed her medicine up and did not take any medicine for the last two days - pt denies chest pain, N/V, shortness of breath

## 2016-09-10 DIAGNOSIS — D62 Acute posthemorrhagic anemia: Secondary | ICD-10-CM | POA: Diagnosis not present

## 2016-09-10 DIAGNOSIS — R531 Weakness: Secondary | ICD-10-CM | POA: Diagnosis not present

## 2016-09-10 DIAGNOSIS — E860 Dehydration: Secondary | ICD-10-CM | POA: Diagnosis not present

## 2016-09-10 DIAGNOSIS — R7989 Other specified abnormal findings of blood chemistry: Secondary | ICD-10-CM | POA: Diagnosis not present

## 2016-10-12 ENCOUNTER — Encounter: Payer: Self-pay | Admitting: *Deleted

## 2016-10-12 ENCOUNTER — Emergency Department: Payer: PPO

## 2016-10-12 ENCOUNTER — Emergency Department
Admission: EM | Admit: 2016-10-12 | Discharge: 2016-10-12 | Disposition: A | Discharge status: Home / Self Care | Payer: PPO | Attending: Emergency Medicine | Admitting: Emergency Medicine

## 2016-10-12 DIAGNOSIS — R079 Chest pain, unspecified: Secondary | ICD-10-CM | POA: Diagnosis not present

## 2016-10-12 DIAGNOSIS — H109 Unspecified conjunctivitis: Secondary | ICD-10-CM | POA: Insufficient documentation

## 2016-10-12 DIAGNOSIS — I2510 Atherosclerotic heart disease of native coronary artery without angina pectoris: Secondary | ICD-10-CM | POA: Diagnosis not present

## 2016-10-12 DIAGNOSIS — H1032 Unspecified acute conjunctivitis, left eye: Secondary | ICD-10-CM

## 2016-10-12 DIAGNOSIS — J449 Chronic obstructive pulmonary disease, unspecified: Secondary | ICD-10-CM | POA: Diagnosis not present

## 2016-10-12 DIAGNOSIS — Z79899 Other long term (current) drug therapy: Secondary | ICD-10-CM | POA: Diagnosis not present

## 2016-10-12 DIAGNOSIS — I10 Essential (primary) hypertension: Secondary | ICD-10-CM | POA: Insufficient documentation

## 2016-10-12 DIAGNOSIS — Z8673 Personal history of transient ischemic attack (TIA), and cerebral infarction without residual deficits: Secondary | ICD-10-CM | POA: Insufficient documentation

## 2016-10-12 DIAGNOSIS — F17210 Nicotine dependence, cigarettes, uncomplicated: Secondary | ICD-10-CM | POA: Insufficient documentation

## 2016-10-12 DIAGNOSIS — H1089 Other conjunctivitis: Secondary | ICD-10-CM | POA: Diagnosis not present

## 2016-10-12 DIAGNOSIS — Z951 Presence of aortocoronary bypass graft: Secondary | ICD-10-CM | POA: Diagnosis not present

## 2016-10-12 DIAGNOSIS — R0789 Other chest pain: Secondary | ICD-10-CM | POA: Diagnosis not present

## 2016-10-12 LAB — CBC WITH DIFFERENTIAL/PLATELET
BASOS ABS: 0.1 10*3/uL (ref 0–0.1)
Basophils Relative: 1 %
Eosinophils Absolute: 0.5 10*3/uL (ref 0–0.7)
Eosinophils Relative: 8 %
HEMATOCRIT: 32.1 % — AB (ref 35.0–47.0)
HEMOGLOBIN: 10.9 g/dL — AB (ref 12.0–16.0)
LYMPHS PCT: 29 %
Lymphs Abs: 2 10*3/uL (ref 1.0–3.6)
MCH: 31.6 pg (ref 26.0–34.0)
MCHC: 34 g/dL (ref 32.0–36.0)
MCV: 93 fL (ref 80.0–100.0)
MONO ABS: 0.3 10*3/uL (ref 0.2–0.9)
Monocytes Relative: 5 %
NEUTROS ABS: 4 10*3/uL (ref 1.4–6.5)
Neutrophils Relative %: 57 %
Platelets: 194 10*3/uL (ref 150–440)
RBC: 3.46 MIL/uL — ABNORMAL LOW (ref 3.80–5.20)
RDW: 14.6 % — AB (ref 11.5–14.5)
WBC: 6.9 10*3/uL (ref 3.6–11.0)

## 2016-10-12 LAB — BASIC METABOLIC PANEL
ANION GAP: 11 (ref 5–15)
BUN: 40 mg/dL — ABNORMAL HIGH (ref 6–20)
CALCIUM: 9.9 mg/dL (ref 8.9–10.3)
CHLORIDE: 103 mmol/L (ref 101–111)
CO2: 21 mmol/L — AB (ref 22–32)
Creatinine, Ser: 2.33 mg/dL — ABNORMAL HIGH (ref 0.44–1.00)
GFR calc Af Amer: 22 mL/min — ABNORMAL LOW (ref >60)
GFR calc non Af Amer: 19 mL/min — ABNORMAL LOW (ref >60)
GLUCOSE: 95 mg/dL (ref 65–99)
Potassium: 5.6 mmol/L — ABNORMAL HIGH (ref 3.5–5.1)
Sodium: 135 mmol/L (ref 135–145)

## 2016-10-12 LAB — TROPONIN I: Troponin I: <0.03 ng/mL (ref <0.03)

## 2016-10-12 MED ORDER — SULFACETAMIDE SODIUM 10 % OP SOLN: 1.0000 [drp] | Freq: Four times a day (QID) | OPHTHALMIC | 0 refills | Status: AC

## 2016-10-12 NOTE — ED Provider Notes (Signed)
[facility] Emergency Department Provider Note  ____________________________________________   First MD Initiated Contact with Patient 10/12/16 2052     (approximate)  I have reviewed the triage vital signs and the nursing notes.   HISTORY  Chief Complaint Chest Pain   HPI Kathryn Cain is a 83 y.o. female with a history of atrial fibrillation as well as coronary artery disease status post CABG who is presenting to the emergency department today with chest pain. She says that she has had chest pain ever since this morning. She says that is lasted for about 5 minutes at a time and is associated with belching. She says that at one point was radiating down her left arm. However, she had multiple episodes that also did not radiate down her left arm. She denies any shortness of breath. Denies any nausea or vomiting. Said that she took Tums which made the chest pain go away. However, once EMS was called and she was having some light chest pain and gave her a Nitro-Tab as well as 324 mg of aspirin which also relieved her chest pain.She is pain-free at this time. Says that the pain was not instigated by activity and came on while at rest.   Past Medical History:  Diagnosis Date  . A-fib ([place])   . Anginal pain ([place])   . Arthritis   . Chronic renal insufficiency   . COPD (chronic obstructive pulmonary disease) ([place])   . Coronary artery disease   . Diverticulosis   . Gout   . Hyperlipidemia   . Hypertension   . Myocardial infarction ([place])   . Nephrolithiasis   . Peripheral vascular disease ([place])   . Stroke [facility])     There are no active problems to display for this patient.   Past Surgical History:  Procedure Laterality Date  . AMPUTATION Right 04/02/2016   Procedure: AMPUTATION FINGER TIP;  Surgeon: Hessie Knows, MD;  Location: [facility] ORS;  Service: Orthopedics;  Laterality: Right;  . APPENDECTOMY    . BREAST SURGERY Right   . CARDIAC SURGERY    .  CORONARY ARTERY BYPASS GRAFT    . DISTAL INTERPHALANGEAL JOINT FUSION Right 11/28/2015   Procedure: DISTAL INTERPHALANGEAL JOINT FUSION;  Surgeon: Hessie Knows, MD;  Location: [facility] ORS;  Service: Orthopedics;  Laterality: Right;  third finger  . KIDNEY STONE SURGERY      Prior to Admission medications   Medication Sig Start Date End Date Taking? Authorizing Provider  allopurinol (ZYLOPRIM) 100 MG tablet Take 100 mg by mouth daily.    [provider]  amiodarone (PACERONE) 200 MG tablet Take 200 mg by mouth daily.    [provider]  amLODipine (NORVASC) 10 MG tablet Take 10 mg by mouth daily.    [provider]  aspirin 81 MG tablet Take 81 mg by mouth daily.    [provider]  clindamycin (CLEOCIN) 300 MG capsule Take 1 capsule (300 mg total) by mouth 3 (three) times daily. 03/28/16   Earleen Newport, MD  diphenhydramine-acetaminophen (TYLENOL PM) 25-500 MG TABS tablet Take 1 tablet by mouth at bedtime as needed.    [provider]  furosemide (LASIX) 40 MG tablet Take 40 mg by mouth daily.    [provider]  HYDROcodone-acetaminophen (NORCO) 5-325 MG tablet Take 1 tablet by mouth every 6 (six) hours as needed for moderate pain. Patient not taking: Reported on 04/02/2016 11/28/15   Hessie Knows, MD  Iron-Vitamins (GERITOL PO) Take  by mouth.    [provider]  metoprolol tartrate (LOPRESSOR) 25 MG tablet Take 50 mg by mouth 2 (two) times daily.     [provider]  Omega-3 Fatty Acids (FISH OIL PO) Take 1 capsule by mouth daily.    [provider]  oxyCODONE-acetaminophen (PERCOCET) 5-325 MG tablet Take 2 tablets by mouth every 6 (six) hours as needed for moderate pain or severe pain. 03/28/16   Earleen Newport, MD  pravastatin (PRAVACHOL) 20 MG tablet Take 80 mg by mouth daily.     [provider]  VITAMIN E PO Take 2 tablets by mouth daily.    [provider]  zolpidem (AMBIEN) 10  MG tablet Take 10 mg by mouth at bedtime as needed for sleep.    [provider]    Allergies Ciprofloxacin hcl and Codeine  History reviewed. No pertinent family history.  Social History Social History  Substance Use Topics  . Smoking status: Current Some Day Smoker    Packs/day: 0.50    Types: Cigarettes  . Smokeless tobacco: Never Used  . Alcohol use No    Review of Systems  Constitutional: No fever/chills Eyes: No visual changes. ENT: No sore throat. Cardiovascular: as above Respiratory: Denies shortness of breath. Gastrointestinal: No abdominal pain.  No nausea, no vomiting.  No diarrhea.  No constipation. Genitourinary: Negative for dysuria. Musculoskeletal: Negative for back pain. Skin: Negative for rash. Neurological: Negative for headaches, focal weakness or numbness.   ____________________________________________   PHYSICAL EXAM:  VITAL SIGNS: ED Triage Vitals  Enc Vitals Group     BP      Pulse      Resp      Temp      Temp src      SpO2      Weight      Height      Head Circumference      Peak Flow      Pain Score      Pain Loc      Pain Edu?      Excl. in [place]?     Constitutional: Alert and oriented. Well appearing and in no acute distress. Eyes: Mild conjunctival erythema to the left eye with a small amount of yellow discharge in both medially and laterally. He says the eyes are painful. Denies any itching or trauma. Head: Atraumatic. Nose: No congestion/rhinnorhea. Mouth/Throat: Mucous membranes are moist.  Neck: No stridor.   Cardiovascular: Normal rate, regular rhythm. Grossly normal heart sounds.   Respiratory: Normal respiratory effort.  No retractions. Lungs CTAB. Gastrointestinal: Soft and nontender. No distention.  Musculoskeletal: No lower extremity tenderness nor edema.  No joint effusions. Neurologic:  Normal speech and language. No gross focal neurologic deficits are appreciated. Skin:  Skin is warm, dry and intact.  No rash noted. Psychiatric: Mood and affect are normal. Speech and behavior are normal.  ____________________________________________   LABS (all labs ordered are listed, but only abnormal results are displayed)  Labs Reviewed  CBC WITH DIFFERENTIAL/PLATELET - Abnormal; Notable for the following:       Result Value   RBC 3.46 (*)    Hemoglobin 10.9 (*)    HCT 32.1 (*)    RDW 14.6 (*)    All other components within normal limits  BASIC METABOLIC PANEL - Abnormal; Notable for the following:    Potassium 5.6 (*)    CO2 21 (*)    BUN 40 (*)    Creatinine, Ser  2.33 (*)    GFR calc non Af Amer 19 (*)    GFR calc Af Amer 22 (*)    All other components within normal limits  TROPONIN I  TROPONIN I   ____________________________________________  EKG  ED ECG REPORT I, Doran Stabler, the attending physician, personally viewed and interpreted this ECG.   Date: 10/12/2016  EKG Time: 2015  Rate: 85  Rhythm: normal sinus rhythm  Axis: Normal  Intervals:none  ST&T Change: No ST segment elevation or depression. No abnormal T-wave inversion. ED ECG REPORT I, Doran Stabler, the attending physician, personally viewed and interpreted this ECG.   Date: 10/12/2016  EKG Time: 2050  Rate: 88  Rhythm: normal sinus rhythm  Axis: Normal  Intervals:none  ST&T Change: No ST segment elevation or depression. No abnormal T-wave inversion.  ____________________________________________  RADIOLOGY  Chest x-ray without any acute pathology. ____________________________________________   PROCEDURES  Procedure(s) performed:   Procedures  Critical Care performed:   ____________________________________________   INITIAL IMPRESSION / ASSESSMENT AND PLAN / ED COURSE  Pertinent labs & imaging results that were available during my care of the patient were reviewed by me and considered in my medical decision making (see chart for  details).  ----------------------------------------- 11:15 PM on 10/12/2016 -----------------------------------------  Patient continues to be pain-free and would like to be discharged at this time. She does not want to stay for further evaluation including repeat troponin. She says that her eye has been having discharge for the past day like try medication. His number recommendation that she be discharged at this time prior to her second troponin. She has a concerning cardiac history. However, she is insistent on being discharged at this time and would like to return for any worsening or concerning symptoms. She has clinical capacity to make her medical decisions and good insight and possible outcomes of not having a further cardiac workup. These consequences may include permanent disability, worsening of her condition and death.     ____________________________________________   FINAL CLINICAL IMPRESSION(S) / ED DIAGNOSES  Conjunctivitis. Chest pain.    NEW MEDICATIONS STARTED DURING THIS VISIT:  New Prescriptions   No medications on file     Note:  This document was prepared using Dragon voice recognition software and may include unintentional dictation errors.     Orbie Pyo, MD 10/12/16 [phone]

## 2016-10-12 NOTE — ED Triage Notes (Signed)
Pt c/o intermittent chest pain starting this morning. Pt denies other associated sxs. Pt states relief w/ NGT 0.4 MG SL administered prior to arrival by EMS. Pt also given ASA 81 mg prior to arrival., Pt in no acute distress at this time,. Pt denies pain at this time.

## 2016-10-13 DIAGNOSIS — H01003 Unspecified blepharitis right eye, unspecified eyelid: Secondary | ICD-10-CM | POA: Diagnosis not present

## 2016-10-19 DIAGNOSIS — D62 Acute posthemorrhagic anemia: Secondary | ICD-10-CM | POA: Diagnosis not present

## 2016-10-22 DIAGNOSIS — M109 Gout, unspecified: Secondary | ICD-10-CM | POA: Diagnosis not present

## 2016-10-22 DIAGNOSIS — J449 Chronic obstructive pulmonary disease, unspecified: Secondary | ICD-10-CM | POA: Diagnosis not present

## 2016-10-22 DIAGNOSIS — R011 Cardiac murmur, unspecified: Secondary | ICD-10-CM | POA: Diagnosis not present

## 2016-10-22 DIAGNOSIS — R0602 Shortness of breath: Secondary | ICD-10-CM | POA: Diagnosis not present

## 2016-10-22 DIAGNOSIS — I208 Other forms of angina pectoris: Secondary | ICD-10-CM | POA: Diagnosis not present

## 2016-10-22 DIAGNOSIS — F17200 Nicotine dependence, unspecified, uncomplicated: Secondary | ICD-10-CM | POA: Diagnosis not present

## 2016-10-22 DIAGNOSIS — I480 Paroxysmal atrial fibrillation: Secondary | ICD-10-CM | POA: Diagnosis not present

## 2016-10-22 DIAGNOSIS — I639 Cerebral infarction, unspecified: Secondary | ICD-10-CM | POA: Diagnosis not present

## 2016-11-03 DIAGNOSIS — C50111 Malignant neoplasm of central portion of right female breast: Secondary | ICD-10-CM | POA: Diagnosis not present

## 2016-11-03 DIAGNOSIS — Z4431 Encounter for fitting and adjustment of external right breast prosthesis: Secondary | ICD-10-CM | POA: Diagnosis not present

## 2016-12-07 DIAGNOSIS — H26063 Combined forms of infantile and juvenile cataract, bilateral: Secondary | ICD-10-CM | POA: Diagnosis not present

## 2016-12-07 DIAGNOSIS — H3509 Other intraretinal microvascular abnormalities: Secondary | ICD-10-CM | POA: Diagnosis not present

## 2016-12-07 DIAGNOSIS — H2513 Age-related nuclear cataract, bilateral: Secondary | ICD-10-CM | POA: Diagnosis not present

## 2016-12-07 DIAGNOSIS — H35033 Hypertensive retinopathy, bilateral: Secondary | ICD-10-CM | POA: Diagnosis not present

## 2016-12-08 DIAGNOSIS — I10 Essential (primary) hypertension: Secondary | ICD-10-CM | POA: Diagnosis not present

## 2016-12-08 DIAGNOSIS — E7800 Pure hypercholesterolemia, unspecified: Secondary | ICD-10-CM | POA: Diagnosis not present

## 2016-12-08 DIAGNOSIS — I679 Cerebrovascular disease, unspecified: Secondary | ICD-10-CM | POA: Diagnosis not present

## 2016-12-08 DIAGNOSIS — R7989 Other specified abnormal findings of blood chemistry: Secondary | ICD-10-CM | POA: Diagnosis not present

## 2016-12-24 DIAGNOSIS — Z4431 Encounter for fitting and adjustment of external right breast prosthesis: Secondary | ICD-10-CM | POA: Diagnosis not present

## 2016-12-24 DIAGNOSIS — C50111 Malignant neoplasm of central portion of right female breast: Secondary | ICD-10-CM | POA: Diagnosis not present

## 2017-01-06 ENCOUNTER — Encounter (INDEPENDENT_AMBULATORY_CARE_PROVIDER_SITE_OTHER): Payer: Self-pay | Admitting: Ophthalmology

## 2017-02-02 DIAGNOSIS — H4311 Vitreous hemorrhage, right eye: Secondary | ICD-10-CM | POA: Diagnosis not present

## 2017-02-08 DIAGNOSIS — R221 Localized swelling, mass and lump, neck: Secondary | ICD-10-CM | POA: Diagnosis not present

## 2017-02-08 DIAGNOSIS — H4310 Vitreous hemorrhage, unspecified eye: Secondary | ICD-10-CM | POA: Diagnosis not present

## 2017-02-08 DIAGNOSIS — Z23 Encounter for immunization: Secondary | ICD-10-CM | POA: Diagnosis not present

## 2017-02-11 ENCOUNTER — Other Ambulatory Visit: Payer: Self-pay | Admitting: Family Medicine

## 2017-02-11 DIAGNOSIS — H4310 Vitreous hemorrhage, unspecified eye: Secondary | ICD-10-CM

## 2017-03-02 DIAGNOSIS — H34239 Retinal artery branch occlusion, unspecified eye: Secondary | ICD-10-CM | POA: Diagnosis not present

## 2017-03-02 DIAGNOSIS — H4311 Vitreous hemorrhage, right eye: Secondary | ICD-10-CM | POA: Diagnosis not present

## 2017-04-05 DIAGNOSIS — H35341 Macular cyst, hole, or pseudohole, right eye: Secondary | ICD-10-CM | POA: Diagnosis not present

## 2017-07-05 ENCOUNTER — Encounter: Payer: Self-pay | Admitting: *Deleted

## 2017-07-08 ENCOUNTER — Ambulatory Visit: Payer: Medicare HMO | Admitting: Anesthesiology

## 2017-07-08 ENCOUNTER — Ambulatory Visit
Admission: RE | Admit: 2017-07-08 | Discharge: 2017-07-08 | Disposition: A | Discharge status: Home / Self Care | Payer: Medicare HMO | Source: Ambulatory Visit | Attending: Ophthalmology | Admitting: Ophthalmology

## 2017-07-08 ENCOUNTER — Encounter
Admission: RE | Disposition: A | Discharge status: Home / Self Care | Payer: Self-pay | Source: Ambulatory Visit | Attending: Ophthalmology

## 2017-07-08 DIAGNOSIS — Z951 Presence of aortocoronary bypass graft: Secondary | ICD-10-CM | POA: Diagnosis not present

## 2017-07-08 DIAGNOSIS — Z79899 Other long term (current) drug therapy: Secondary | ICD-10-CM | POA: Insufficient documentation

## 2017-07-08 DIAGNOSIS — F17200 Nicotine dependence, unspecified, uncomplicated: Secondary | ICD-10-CM | POA: Insufficient documentation

## 2017-07-08 DIAGNOSIS — G4730 Sleep apnea, unspecified: Secondary | ICD-10-CM | POA: Diagnosis not present

## 2017-07-08 DIAGNOSIS — J449 Chronic obstructive pulmonary disease, unspecified: Secondary | ICD-10-CM | POA: Insufficient documentation

## 2017-07-08 DIAGNOSIS — H2511 Age-related nuclear cataract, right eye: Secondary | ICD-10-CM | POA: Insufficient documentation

## 2017-07-08 DIAGNOSIS — F329 Major depressive disorder, single episode, unspecified: Secondary | ICD-10-CM | POA: Insufficient documentation

## 2017-07-08 DIAGNOSIS — N289 Disorder of kidney and ureter, unspecified: Secondary | ICD-10-CM | POA: Diagnosis not present

## 2017-07-08 DIAGNOSIS — Z853 Personal history of malignant neoplasm of breast: Secondary | ICD-10-CM | POA: Insufficient documentation

## 2017-07-08 DIAGNOSIS — I10 Essential (primary) hypertension: Secondary | ICD-10-CM | POA: Insufficient documentation

## 2017-07-08 DIAGNOSIS — M1990 Unspecified osteoarthritis, unspecified site: Secondary | ICD-10-CM | POA: Diagnosis not present

## 2017-07-08 DIAGNOSIS — I2510 Atherosclerotic heart disease of native coronary artery without angina pectoris: Secondary | ICD-10-CM | POA: Insufficient documentation

## 2017-07-08 DIAGNOSIS — Z8673 Personal history of transient ischemic attack (TIA), and cerebral infarction without residual deficits: Secondary | ICD-10-CM | POA: Diagnosis not present

## 2017-07-08 HISTORY — DX: Depression, unspecified: F32.A

## 2017-07-08 HISTORY — DX: Unspecified hearing loss, unspecified ear: H91.90

## 2017-07-08 HISTORY — PX: CATARACT EXTRACTION W/PHACO: SHX586

## 2017-07-08 HISTORY — DX: Major depressive disorder, single episode, unspecified: F32.9

## 2017-07-08 HISTORY — DX: Malignant (primary) neoplasm, unspecified: C80.1

## 2017-07-08 SURGERY — PHACOEMULSIFICATION, CATARACT, WITH IOL INSERTION
Anesthesia: Monitor Anesthesia Care | Laterality: Right

## 2017-07-08 MED ORDER — SODIUM CHLORIDE 0.9 % IV SOLN
INTRAVENOUS | Status: DC
  Administered 2017-07-08: 09:00:00 via INTRAVENOUS

## 2017-07-08 MED ORDER — BSS IO SOLN
INTRAOCULAR | Status: DC | PRN
  Administered 2017-07-08: 1 via INTRAOCULAR

## 2017-07-08 MED ORDER — MOXIFLOXACIN HCL 0.5 % OP SOLN
OPHTHALMIC | Status: AC
  Filled 2017-07-08: qty 3

## 2017-07-08 MED ORDER — SODIUM HYALURONATE 23 MG/ML IO SOLN
INTRAOCULAR | Status: AC
  Filled 2017-07-08: qty 0.6

## 2017-07-08 MED ORDER — SODIUM HYALURONATE 10 MG/ML IO SOLN
INTRAOCULAR | Status: DC | PRN
  Administered 2017-07-08: 0.55 mL via INTRAOCULAR

## 2017-07-08 MED ORDER — LIDOCAINE HCL (PF) 4 % IJ SOLN
INTRAMUSCULAR | Status: AC
  Filled 2017-07-08: qty 5

## 2017-07-08 MED ORDER — ARMC OPHTHALMIC DILATING DROPS
OPHTHALMIC | Status: AC
  Administered 2017-07-08: 1 via OPHTHALMIC
  Filled 2017-07-08: qty 0.4

## 2017-07-08 MED ORDER — POLYMYXIN B-TRIMETHOPRIM 10000-0.1 UNIT/ML-% OP SOLN
OPHTHALMIC | Status: AC
  Filled 2017-07-08: qty 10

## 2017-07-08 MED ORDER — POVIDONE-IODINE 5 % OP SOLN
OPHTHALMIC | Status: AC
  Filled 2017-07-08: qty 30

## 2017-07-08 MED ORDER — FENTANYL CITRATE (PF) 100 MCG/2ML IJ SOLN
INTRAMUSCULAR | Status: AC
  Filled 2017-07-08: qty 2

## 2017-07-08 MED ORDER — CEFUROXIME OPHTHALMIC INJECTION 1 MG/0.1 ML
INJECTION | OPHTHALMIC | Status: AC
  Filled 2017-07-08: qty 0.1

## 2017-07-08 MED ORDER — POLYMYXIN B-TRIMETHOPRIM 10000-0.1 UNIT/ML-% OP SOLN: 1.0000 [drp] | OPHTHALMIC | Status: DC | PRN

## 2017-07-08 MED ORDER — BSS IO SOLN
INTRAOCULAR | Status: DC | PRN
  Administered 2017-07-08: .2 mL via OPHTHALMIC

## 2017-07-08 MED ORDER — POVIDONE-IODINE 5 % OP SOLN
OPHTHALMIC | Status: DC | PRN
  Administered 2017-07-08: 1 via OPHTHALMIC

## 2017-07-08 MED ORDER — MIDAZOLAM HCL 2 MG/2ML IJ SOLN
INTRAMUSCULAR | Status: AC
  Filled 2017-07-08: qty 2

## 2017-07-08 MED ORDER — POLYMYXIN B-TRIMETHOPRIM 10000-0.1 UNIT/ML-% OP SOLN
OPHTHALMIC | Status: DC | PRN
  Administered 2017-07-08: 2 [drp] via OPHTHALMIC

## 2017-07-08 MED ORDER — FENTANYL CITRATE (PF) 100 MCG/2ML IJ SOLN
INTRAMUSCULAR | Status: DC | PRN
  Administered 2017-07-08: 50 ug via INTRAVENOUS
  Administered 2017-07-08 (×2): 25 ug via INTRAVENOUS

## 2017-07-08 MED ORDER — SODIUM HYALURONATE 23 MG/ML IO SOLN
INTRAOCULAR | Status: DC | PRN
  Administered 2017-07-08: 0.6 mL via INTRAOCULAR

## 2017-07-08 MED ORDER — EPINEPHRINE PF 1 MG/ML IJ SOLN
INTRAMUSCULAR | Status: AC
  Filled 2017-07-08: qty 1

## 2017-07-08 MED ORDER — MIDAZOLAM HCL 2 MG/2ML IJ SOLN
INTRAMUSCULAR | Status: DC | PRN
  Administered 2017-07-08 (×2): 0.5 mg via INTRAVENOUS

## 2017-07-08 MED ORDER — CEFUROXIME OPHTHALMIC INJECTION 1 MG/0.1 ML
INJECTION | OPHTHALMIC | Status: DC | PRN
  Administered 2017-07-08: 0.1 mL via INTRACAMERAL

## 2017-07-08 MED ORDER — ARMC OPHTHALMIC DILATING DROPS
1.0000 "application " | OPHTHALMIC | Status: AC
  Administered 2017-07-08 (×3): 1 via OPHTHALMIC

## 2017-07-08 SURGICAL SUPPLY — 18 items
DISSECTOR HYDRO NUCLEUS 50X22 (MISCELLANEOUS) ×3 IMPLANT
GLOVE BIO SURGEON STRL SZ8 (GLOVE) ×3 IMPLANT
GLOVE BIOGEL M 6.5 STRL (GLOVE) ×3 IMPLANT
GLOVE SURG LX 7.5 STRW (GLOVE) ×2
GLOVE SURG LX STRL 7.5 STRW (GLOVE) ×1 IMPLANT
GOWN STRL REUS W/ TWL LRG LVL3 (GOWN DISPOSABLE) ×2 IMPLANT
GOWN STRL REUS W/TWL LRG LVL3 (GOWN DISPOSABLE) ×4
LABEL CATARACT MEDS ST (LABEL) ×3 IMPLANT
LENS IOL TECNIS ITEC 23.0 (Intraocular Lens) ×3 IMPLANT
PACK CATARACT (MISCELLANEOUS) ×3 IMPLANT
PACK CATARACT KING (MISCELLANEOUS) ×3 IMPLANT
PACK EYE AFTER SURG (MISCELLANEOUS) ×3 IMPLANT
SOL BAL SALT 15ML (MISCELLANEOUS) ×3
SOL BSS BAG (MISCELLANEOUS) ×3
SOLUTION BAL SALT 15ML (MISCELLANEOUS) ×1 IMPLANT
SOLUTION BSS BAG (MISCELLANEOUS) ×1 IMPLANT
WATER STERILE IRR 250ML POUR (IV SOLUTION) ×3 IMPLANT
WIPE NON LINTING 3.25X3.25 (MISCELLANEOUS) ×3 IMPLANT

## 2017-07-08 NOTE — Discharge Instructions (Signed)
Eye Surgery Discharge Instructions  Expect mild scratchy sensation or mild soreness. DO NOT RUB YOUR EYE!  The day of surgery:  Minimal physical activity, but bed rest is not required  No reading, computer work, or close hand work  No bending, lifting, or straining.  May watch TV  For 24 hours:  No driving, legal decisions, or alcoholic beverages  Safety precautions  Eat anything you prefer: It is better to start with liquids, then soup then solid foods.  _____ Eye patch should be worn until postoperative exam tomorrow.  ____ Solar shield eyeglasses should be worn for comfort in the sunlight/patch while sleeping  Resume all regular medications including aspirin or Coumadin if these were discontinued prior to surgery. You may shower, bathe, shave, or wash your hair. Tylenol may be taken for mild discomfort.  Call your doctor if you experience significant pain, nausea, or vomiting, fever > 101 or other signs of infection. [phone] or [phone] Specific instructions:  Follow-up Information    Eulogio Bear, MD Follow up.   Specialty:  Ophthalmology Why:  March 8 at 10:10am Contact information: [address] [place] [address] [phone]

## 2017-07-08 NOTE — Op Note (Signed)
OPERATIVE NOTE  Kathryn Cain [id number] 07/08/2017   PREOPERATIVE DIAGNOSIS:  Nuclear sclerotic cataract right eye.  H25.11   POSTOPERATIVE DIAGNOSIS:    Nuclear sclerotic cataract right eye.     PROCEDURE:  Phacoemusification with posterior chamber intraocular lens placement of the right eye   LENS:   Implant Name Type Inv. Item Serial No. Manufacturer Lot No. LRB No. Used  LENS IOL DIOP 23.0 - [id number] Intraocular Lens LENS IOL DIOP 23.0 [id number] AMO  Right 1       PCB00 +23.0   ULTRASOUND TIME: 0 minutes 32 seconds.  CDE 4.08   SURGEON:  Benay Pillow, MD, MPH  ANESTHESIOLOGIST: Anesthesiologist: Piscitello, Precious Haws, MD CRNA: Demetrius Charity, CRNA; Philbert Riser, CRNA   ANESTHESIA:  Topical with tetracaine drops augmented with 1% preservative-free intracameral lidocaine.  ESTIMATED BLOOD LOSS: less than 1 mL.   COMPLICATIONS:  None.   DESCRIPTION OF PROCEDURE:  The patient was identified in the holding room and transported to the operating room and placed in the supine position under the operating microscope.  The right eye was identified as the operative eye and it was prepped and draped in the usual sterile ophthalmic fashion.   A 1.0 millimeter clear-corneal paracentesis was made at the 10:30 position. 0.5 ml of preservative-free 1% lidocaine with epinephrine was injected into the anterior chamber.  The anterior chamber was filled with Healon 5 viscoelastic.  A 2.4 millimeter keratome was used to make a near-clear corneal incision at the 8:00 position.  A curvilinear capsulorrhexis was made with a cystotome and capsulorrhexis forceps.  Balanced salt solution was used to hydrodissect and hydrodelineate the nucleus.   Phacoemulsification was then used in stop and chop fashion to remove the lens nucleus and epinucleus.  The remaining cortex was then removed using the irrigation and aspiration handpiece. Healon was then placed into the capsular bag to distend it  for lens placement.  A lens was then injected into the capsular bag.  The remaining viscoelastic was aspirated.   Wounds were hydrated with balanced salt solution.  The anterior chamber was inflated to a physiologic pressure with balanced salt solution.   Intracameral cefuroxime 0.1 mL at 10 mg/mL was injected into the eye.  No wound leaks were noted.  Polytrim eye drops were placed on the right eye.  The patient was taken to the recovery room in stable condition without complications of anesthesia or surgery  Benay Pillow 07/08/2017, 9:15 AM

## 2017-07-08 NOTE — H&P (Signed)
The History and Physical notes are on paper, have been signed, and are to be scanned.   I have examined the patient and there are no changes to the H&P.   Kathryn Cain 07/08/2017 8:40 AM

## 2017-07-08 NOTE — Transfer of Care (Signed)
Immediate Anesthesia Transfer of Care Note  Patient: Kathryn Cain  Procedure(s) Performed: CATARACT EXTRACTION PHACO AND INTRAOCULAR LENS PLACEMENT (IOC) (Right )  Patient Location: PACU  Anesthesia Type:MAC  Level of Consciousness: awake, alert  and oriented  Airway & Oxygen Therapy: Patient Spontanous Breathing  Post-op Assessment: Report given to RN and Post -op Vital signs reviewed and stable  Post vital signs: Reviewed and stable  Last Vitals:  Vitals:   07/08/17 0737  BP: (!) 186/96  Pulse: 77  Resp: 16  Temp: 37.1 C  SpO2: 100%    Last Pain:  Vitals:   07/08/17 0737  TempSrc: Tympanic         Complications: No apparent anesthesia complications

## 2017-07-08 NOTE — Anesthesia Postprocedure Evaluation (Signed)
Anesthesia Post Note  Patient: Kathryn Cain  Procedure(s) Performed: CATARACT EXTRACTION PHACO AND INTRAOCULAR LENS PLACEMENT (IOC) (Right )  Patient location during evaluation: PACU Anesthesia Type: MAC Level of consciousness: awake and alert Pain management: pain level controlled Vital Signs Assessment: post-procedure vital signs reviewed and stable Respiratory status: spontaneous breathing, nonlabored ventilation, respiratory function stable and patient connected to nasal cannula oxygen Cardiovascular status: stable and blood pressure returned to baseline Postop Assessment: no apparent nausea or vomiting Anesthetic complications: no     Last Vitals:  Vitals:   07/08/17 0916 07/08/17 0927  BP: (!) 167/75 (!) 161/70  Pulse: 76 72  Resp: 16   Temp: 36.9 C   SpO2: 100% 100%    Last Pain:  Vitals:   07/08/17 0737  TempSrc: Tympanic                 Precious Haws Aricela Bertagnolli

## 2017-07-08 NOTE — OR Nursing (Signed)
Discharge instructions discussed with pt and son. Son voices understanding

## 2017-07-08 NOTE — Anesthesia Post-op Follow-up Note (Signed)
Anesthesia QCDR form completed.        

## 2017-07-08 NOTE — Anesthesia Preprocedure Evaluation (Signed)
Anesthesia Evaluation  Patient identified by MRN, date of birth, ID band Patient awake    Reviewed: Allergy & Precautions, NPO status , Patient's Chart, lab work & pertinent test results  History of Anesthesia Complications Negative for: history of anesthetic complications  Airway Mallampati: II  TM Distance: >3 FB Neck ROM: Full    Dental  (+) Upper Dentures, Lower Dentures   Pulmonary neg sleep apnea, COPD, Current Smoker,    breath sounds clear to auscultation- rhonchi (-) wheezing      Cardiovascular hypertension, (-) angina+ CAD, + Past MI, + CABG and + Peripheral Vascular Disease  + dysrhythmias (paroxysmal) Atrial Fibrillation  Rhythm:Regular Rate:Normal - Systolic murmurs and - Diastolic murmurs    Neuro/Psych PSYCHIATRIC DISORDERS Depression CVA, No Residual Symptoms negative psych ROS   GI/Hepatic negative GI ROS, Neg liver ROS,   Endo/Other  negative endocrine ROSneg diabetes  Renal/GU CRFRenal disease     Musculoskeletal  (+) Arthritis ,   Abdominal (+) - obese,   Peds  Hematology negative hematology ROS (+)   Anesthesia Other Findings Past Medical History: No date: A-fib (HCC) No date: Anginal pain (HCC) No date: Arthritis No date: Chronic renal insufficiency No date: COPD (chronic obstructive pulmonary disease) (* No date: Coronary artery disease No date: Diverticulosis No date: Gout No date: Hyperlipidemia No date: Hypertension No date: Myocardial infarction No date: Nephrolithiasis No date: Peripheral vascular disease (HCC) No date: Stroke [facility])   Reproductive/Obstetrics                             Anesthesia Physical  Anesthesia Plan  ASA: III  Anesthesia Plan: MAC   Post-op Pain Management:    Induction: Intravenous  PONV Risk Score and Plan:   Airway Management Planned: Natural Airway and Nasal Cannula  Additional Equipment:   Intra-op Plan:    Post-operative Plan:   Informed Consent: I have reviewed the patients History and Physical, chart, labs and discussed the procedure including the risks, benefits and alternatives for the proposed anesthesia with the patient or authorized representative who has indicated his/her understanding and acceptance.   Dental Advisory Given  Plan Discussed with: Anesthesiologist, CRNA and Surgeon  Anesthesia Plan Comments: (Patient and son consented for risks of anesthesia including but not limited to:  - adverse reactions to medications - damage to teeth, lips or other oral mucosa - sore throat or hoarseness - Damage to heart, brain, lungs or loss of life  They voiced understanding.)        Anesthesia Quick Evaluation

## 2017-07-15 ENCOUNTER — Other Ambulatory Visit: Payer: Self-pay

## 2017-07-15 ENCOUNTER — Inpatient Hospital Stay
Admission: EM | Admit: 2017-07-15 | Discharge: 2017-07-17 | DRG: 378 | Disposition: A | Discharge status: Home / Self Care | Payer: Medicare HMO | Attending: Internal Medicine | Admitting: Internal Medicine

## 2017-07-15 DIAGNOSIS — Z7982 Long term (current) use of aspirin: Secondary | ICD-10-CM

## 2017-07-15 DIAGNOSIS — K2980 Duodenitis without bleeding: Secondary | ICD-10-CM

## 2017-07-15 DIAGNOSIS — F17210 Nicotine dependence, cigarettes, uncomplicated: Secondary | ICD-10-CM | POA: Diagnosis present

## 2017-07-15 DIAGNOSIS — K2970 Gastritis, unspecified, without bleeding: Secondary | ICD-10-CM

## 2017-07-15 DIAGNOSIS — I739 Peripheral vascular disease, unspecified: Secondary | ICD-10-CM | POA: Diagnosis present

## 2017-07-15 DIAGNOSIS — Z853 Personal history of malignant neoplasm of breast: Secondary | ICD-10-CM | POA: Diagnosis not present

## 2017-07-15 DIAGNOSIS — D509 Iron deficiency anemia, unspecified: Secondary | ICD-10-CM | POA: Diagnosis present

## 2017-07-15 DIAGNOSIS — H9190 Unspecified hearing loss, unspecified ear: Secondary | ICD-10-CM | POA: Diagnosis present

## 2017-07-15 DIAGNOSIS — I480 Paroxysmal atrial fibrillation: Secondary | ICD-10-CM | POA: Diagnosis present

## 2017-07-15 DIAGNOSIS — Z951 Presence of aortocoronary bypass graft: Secondary | ICD-10-CM | POA: Diagnosis not present

## 2017-07-15 DIAGNOSIS — K2901 Acute gastritis with bleeding: Secondary | ICD-10-CM | POA: Diagnosis present

## 2017-07-15 DIAGNOSIS — Z87442 Personal history of urinary calculi: Secondary | ICD-10-CM | POA: Diagnosis not present

## 2017-07-15 DIAGNOSIS — J439 Emphysema, unspecified: Secondary | ICD-10-CM | POA: Diagnosis present

## 2017-07-15 DIAGNOSIS — K2981 Duodenitis with bleeding: Secondary | ICD-10-CM | POA: Diagnosis present

## 2017-07-15 DIAGNOSIS — I2510 Atherosclerotic heart disease of native coronary artery without angina pectoris: Secondary | ICD-10-CM | POA: Diagnosis present

## 2017-07-15 DIAGNOSIS — M109 Gout, unspecified: Secondary | ICD-10-CM | POA: Diagnosis present

## 2017-07-15 DIAGNOSIS — K5730 Diverticulosis of large intestine without perforation or abscess without bleeding: Secondary | ICD-10-CM | POA: Diagnosis present

## 2017-07-15 DIAGNOSIS — I129 Hypertensive chronic kidney disease with stage 1 through stage 4 chronic kidney disease, or unspecified chronic kidney disease: Secondary | ICD-10-CM | POA: Diagnosis present

## 2017-07-15 DIAGNOSIS — D649 Anemia, unspecified: Secondary | ICD-10-CM

## 2017-07-15 DIAGNOSIS — N184 Chronic kidney disease, stage 4 (severe): Secondary | ICD-10-CM | POA: Diagnosis present

## 2017-07-15 DIAGNOSIS — K641 Second degree hemorrhoids: Secondary | ICD-10-CM | POA: Diagnosis present

## 2017-07-15 DIAGNOSIS — I252 Old myocardial infarction: Secondary | ICD-10-CM

## 2017-07-15 DIAGNOSIS — K922 Gastrointestinal hemorrhage, unspecified: Secondary | ICD-10-CM | POA: Diagnosis present

## 2017-07-15 DIAGNOSIS — Z8673 Personal history of transient ischemic attack (TIA), and cerebral infarction without residual deficits: Secondary | ICD-10-CM

## 2017-07-15 DIAGNOSIS — D124 Benign neoplasm of descending colon: Secondary | ICD-10-CM | POA: Diagnosis present

## 2017-07-15 LAB — CBC WITH DIFFERENTIAL/PLATELET
Basophils Absolute: 0.1 K/uL (ref 0–0.1)
Basophils Relative: 1 %
Eosinophils Absolute: 0.3 K/uL (ref 0–0.7)
Eosinophils Relative: 4 %
HCT: 25.1 % — ABNORMAL LOW (ref 35.0–47.0)
Hemoglobin: 8.2 g/dL — ABNORMAL LOW (ref 12.0–16.0)
Lymphocytes Relative: 9 %
Lymphs Abs: 0.6 K/uL — ABNORMAL LOW (ref 1.0–3.6)
MCH: 31.3 pg (ref 26.0–34.0)
MCHC: 32.8 g/dL (ref 32.0–36.0)
MCV: 95.5 fL (ref 80.0–100.0)
Monocytes Absolute: 0.5 K/uL (ref 0.2–0.9)
Monocytes Relative: 8 %
Neutro Abs: 5.2 K/uL (ref 1.4–6.5)
Neutrophils Relative %: 78 %
Platelets: 162 K/uL (ref 150–440)
RBC: 2.63 MIL/uL — ABNORMAL LOW (ref 3.80–5.20)
RDW: 13.9 % (ref 11.5–14.5)
WBC: 6.6 K/uL (ref 3.6–11.0)

## 2017-07-15 LAB — BASIC METABOLIC PANEL
Anion gap: 10 (ref 5–15)
BUN: 40 mg/dL — AB (ref 6–20)
CHLORIDE: 108 mmol/L (ref 101–111)
CO2: 20 mmol/L — ABNORMAL LOW (ref 22–32)
Calcium: 8.6 mg/dL — ABNORMAL LOW (ref 8.9–10.3)
Creatinine, Ser: 2.91 mg/dL — ABNORMAL HIGH (ref 0.44–1.00)
GFR calc Af Amer: 16 mL/min — ABNORMAL LOW (ref >60)
GFR calc non Af Amer: 14 mL/min — ABNORMAL LOW (ref >60)
Glucose, Bld: 95 mg/dL (ref 65–99)
POTASSIUM: 4.9 mmol/L (ref 3.5–5.1)
SODIUM: 138 mmol/L (ref 135–145)

## 2017-07-15 LAB — URINALYSIS, COMPLETE (UACMP) WITH MICROSCOPIC
Bacteria, UA: NONE SEEN
Bilirubin Urine: NEGATIVE
Glucose, UA: NEGATIVE mg/dL
HGB URINE DIPSTICK: NEGATIVE
Ketones, ur: NEGATIVE mg/dL
NITRITE: NEGATIVE
Protein, ur: 100 mg/dL — AB
SPECIFIC GRAVITY, URINE: 1.011 (ref 1.005–1.030)
pH: 7 (ref 5.0–8.0)

## 2017-07-15 LAB — PROTIME-INR
INR: 0.97
PROTHROMBIN TIME: 12.8 s (ref 11.4–15.2)

## 2017-07-15 LAB — HEMOGLOBIN AND HEMATOCRIT, BLOOD
HCT: 23.4 % — ABNORMAL LOW (ref 35.0–47.0)
Hemoglobin: 7.6 g/dL — ABNORMAL LOW (ref 12.0–16.0)

## 2017-07-15 LAB — TROPONIN I: Troponin I: 0.03 ng/mL (ref <0.03)

## 2017-07-15 MED ORDER — ALBUTEROL SULFATE HFA 108 (90 BASE) MCG/ACT IN AERS: 2.0000 | INHALATION_SPRAY | Freq: Four times a day (QID) | RESPIRATORY_TRACT | Status: DC | PRN

## 2017-07-15 MED ORDER — ONDANSETRON HCL 4 MG PO TABS
4.0000 mg | ORAL_TABLET | Freq: Four times a day (QID) | ORAL | Status: DC | PRN
Start: 2017-07-15 — End: 2017-07-17

## 2017-07-15 MED ORDER — PANTOPRAZOLE SODIUM 40 MG IV SOLR
40.0000 mg | Freq: Two times a day (BID) | INTRAVENOUS | Status: DC
  Administered 2017-07-15 – 2017-07-16 (×4): 40 mg via INTRAVENOUS
  Filled 2017-07-15 (×4): qty 40

## 2017-07-15 MED ORDER — ACETAMINOPHEN 650 MG RE SUPP: 650.0000 mg | Freq: Four times a day (QID) | RECTAL | Status: DC | PRN

## 2017-07-15 MED ORDER — PNEUMOCOCCAL VAC POLYVALENT 25 MCG/0.5ML IJ INJ
0.5000 mL | INJECTION | INTRAMUSCULAR | Status: DC
Start: 2017-07-16 — End: 2017-07-16

## 2017-07-15 MED ORDER — SODIUM CHLORIDE 0.9 % IV SOLN
INTRAVENOUS | Status: DC
  Administered 2017-07-15 – 2017-07-16 (×3): via INTRAVENOUS

## 2017-07-15 MED ORDER — HYDRALAZINE HCL 20 MG/ML IJ SOLN
10.0000 mg | INTRAMUSCULAR | Status: DC | PRN
  Administered 2017-07-15 (×2): 10 mg via INTRAVENOUS
  Filled 2017-07-15 (×3): qty 1

## 2017-07-15 MED ORDER — ALBUTEROL SULFATE (2.5 MG/3ML) 0.083% IN NEBU: 2.5000 mg | INHALATION_SOLUTION | Freq: Four times a day (QID) | RESPIRATORY_TRACT | Status: DC | PRN

## 2017-07-15 MED ORDER — AMLODIPINE BESYLATE 5 MG PO TABS
5.0000 mg | ORAL_TABLET | Freq: Every day | ORAL | Status: DC
  Administered 2017-07-15 – 2017-07-16 (×2): 5 mg via ORAL
  Filled 2017-07-15 (×2): qty 1

## 2017-07-15 MED ORDER — ACETAMINOPHEN 325 MG PO TABS
650.0000 mg | ORAL_TABLET | Freq: Four times a day (QID) | ORAL | Status: DC | PRN
Start: 2017-07-15 — End: 2017-07-17
  Administered 2017-07-15 – 2017-07-16 (×4): 650 mg via ORAL
  Filled 2017-07-15 (×4): qty 2

## 2017-07-15 MED ORDER — METOPROLOL TARTRATE 25 MG PO TABS
25.0000 mg | ORAL_TABLET | Freq: Every day | ORAL | Status: DC
  Administered 2017-07-15 – 2017-07-17 (×3): 25 mg via ORAL
  Filled 2017-07-15 (×3): qty 1

## 2017-07-15 MED ORDER — ONDANSETRON HCL 4 MG/2ML IJ SOLN
4.0000 mg | Freq: Four times a day (QID) | INTRAMUSCULAR | Status: DC | PRN
  Administered 2017-07-15 – 2017-07-16 (×3): 4 mg via INTRAVENOUS
  Filled 2017-07-15 (×3): qty 2

## 2017-07-15 MED ORDER — PEG 3350-KCL-NA BICARB-NACL 420 G PO SOLR
4000.0000 mL | Freq: Once | ORAL | Status: AC
  Administered 2017-07-15: 20:00:00 4000 mL via ORAL
  Filled 2017-07-15: qty 4000

## 2017-07-15 NOTE — H&P (Signed)
[facility] [place] NAME: Kathryn Cain    MR#:  [id number]  DATE OF BIRTH:  05/06/1933  DATE OF ADMISSION:  07/15/2017  PRIMARY CARE PHYSICIAN: Shari Prows, [facility]   REQUESTING/REFERRING PHYSICIAN:   CHIEF COMPLAINT:   Chief Complaint  Patient presents with  . Anemia    HISTORY OF PRESENT ILLNESS: Kathryn Cain  is a 84 y.o. female with a known history of atrial fibrillation, breast cancer, chronic renal failure, COPD, diverticulosis, gout, hyperlipidemia, hypertension, peripheral vascular disease was referred from [facility] primary care office for low hemoglobin hematocrit and for blood transfusion.  Patient has generalized weakness and fatigue and dizziness.  She is on oral aspirin as outpatient.  Her hemoglobin was more than 11.1 year ago but currently is around 8.3.  No vomiting of blood.  Patient does not have any frank rectal bleed.  But has been having low hemoglobin for the last few months and has been dropping.  No hematemesis, hemoptysis no complaints of any chest pain, shortness of breath.  Has a hearing aid for difficulty hearing.  Hospitalist service was consulted for further care.  Stool guaiac was negative in the emergency room as per ER physician.  PAST MEDICAL HISTORY:   Past Medical History:  Diagnosis Date  . A-fib ([place])   . Anginal pain ([place])   . Arthritis   . Cancer (HCC)    BREAST  . Chronic renal insufficiency   . COPD (chronic obstructive pulmonary disease) ([place])   . Coronary artery disease   . Depression   . Diverticulosis   . Gout   . HOH (hard of hearing)   . Hyperlipidemia   . Hypertension   . Myocardial infarction ([place])   . Nephrolithiasis   . Peripheral vascular disease ([place])   . Stroke [facility])    2013    PAST SURGICAL HISTORY:  Past Surgical History:  Procedure Laterality Date  . AMPUTATION Right 04/02/2016   Procedure: AMPUTATION FINGER TIP;  Surgeon: Hessie Knows, MD;  Location:  [facility] ORS;  Service: Orthopedics;  Laterality: Right;  . APPENDECTOMY    . BREAST SURGERY Right   . CARDIAC SURGERY    . CATARACT EXTRACTION W/PHACO Right 07/08/2017   Procedure: CATARACT EXTRACTION PHACO AND INTRAOCULAR LENS PLACEMENT (IOC);  Surgeon: Eulogio Bear, MD;  Location: [facility] ORS;  Service: Ophthalmology;  Laterality: Right;  fluid lot #[id number]  exp 02/01/2019 [place]   00:32.2 AP%   12.7 CDE  4.08   . CORONARY ARTERY BYPASS GRAFT     2013  . DISTAL INTERPHALANGEAL JOINT FUSION Right 11/28/2015   Procedure: DISTAL INTERPHALANGEAL JOINT FUSION;  Surgeon: Hessie Knows, MD;  Location: [facility] ORS;  Service: Orthopedics;  Laterality: Right;  third finger  . KIDNEY STONE SURGERY    . MASTECTOMY Right    1986    SOCIAL HISTORY:  Social History   Tobacco Use  . Smoking status: Current Some Day Smoker    Packs/day: 0.50    Types: Cigarettes  . Smokeless tobacco: Never Used  Substance Use Topics  . Alcohol use: No    FAMILY HISTORY: No family history on file.  DRUG ALLERGIES:  Allergies  Allergen Reactions  . Amiodarone Itching  . Ciprofloxacin Hcl Other (See Comments)    Unknown reaction  . Codeine Itching  . Eliquis [Apixaban]     REVIEW OF SYSTEMS:   CONSTITUTIONAL: No fever, has fatigue and weakness.  EYES: No blurred or double  vision.  EARS, NOSE, AND THROAT: No tinnitus or ear pain.  RESPIRATORY: No cough, shortness of breath, wheezing or hemoptysis.  CARDIOVASCULAR: No chest pain, orthopnea, edema.  GASTROINTESTINAL: No nausea, vomiting, diarrhea or abdominal pain.  GENITOURINARY: No dysuria, hematuria.  ENDOCRINE: No polyuria, nocturia,  HEMATOLOGY: No anemia, easy bruising or bleeding SKIN: No rash or lesion. MUSCULOSKELETAL: No joint pain or arthritis.   NEUROLOGIC: No tingling, numbness, weakness.  Has dizziness PSYCHIATRY: No anxiety or depression.   MEDICATIONS AT HOME:  Prior to Admission medications   Medication Sig Start Date End Date Taking?  Authorizing Provider  allopurinol (ZYLOPRIM) 100 MG tablet Take 150 mg by mouth daily.   Yes [provider]  amLODipine (NORVASC) 5 MG tablet Take 5 mg by mouth daily.    Yes [provider]  aspirin 81 MG tablet Take 81 mg by mouth daily.   Yes [provider]  citalopram (CELEXA) 20 MG tablet Take 20 mg by mouth daily.   Yes [provider]  diphenhydramine-acetaminophen (TYLENOL PM) 25-500 MG TABS tablet Take 1 tablet by mouth at bedtime as needed (for sleep).    Yes [provider]  furosemide (LASIX) 40 MG tablet Take 40 mg by mouth daily as needed for fluid.    Yes [provider]  Iron-Vitamins (GERITOL PO) Take 1 tablet by mouth daily.    Yes [provider]  metoprolol tartrate (LOPRESSOR) 25 MG tablet Take 25 mg by mouth daily.    Yes [provider]  Omega-3 Fatty Acids (FISH OIL PO) Take 1 capsule by mouth daily.   Yes [provider]  potassium chloride SA (K-DUR,KLOR-CON) 20 MEQ tablet Take 20 mEq by mouth daily.   Yes [provider]  pravastatin (PRAVACHOL) 80 MG tablet Take 80 mg by mouth at bedtime.    Yes [provider]  PROAIR HFA 108 (90 Base) MCG/ACT inhaler Inhale 2 puffs into the lungs every 6 (six) hours as needed for shortness of breath or wheezing. 05/28/17  Yes [provider]  traMADol (ULTRAM) 50 MG tablet Take by mouth every 8 (eight) hours as needed.   Yes [provider]  traZODone (DESYREL) 50 MG tablet Take 50 mg by mouth at bedtime.   Yes [provider]  HYDROcodone-acetaminophen (NORCO) 5-325 MG tablet Take 1 tablet by mouth every 6 (six) hours as needed for moderate pain. Patient not taking: Reported on 04/02/2016 11/28/15   Hessie Knows, MD  oxyCODONE-acetaminophen (PERCOCET) 5-325 MG tablet Take 2 tablets by mouth every 6 (six) hours as needed for moderate pain or severe pain. Patient not taking: Reported on 06/30/2017 03/28/16    Earleen Newport, MD      PHYSICAL EXAMINATION:   VITAL SIGNS: Blood pressure (!) 216/88, pulse 85, temperature 98.6 F (37 C), temperature source Oral, resp. rate 16, height 5\' 4"  (1.626 m), weight 63.5 kg (140 lb), SpO2 100 %.  GENERAL:  84 y.o.-year-old patient lying in the bed with no acute distress.  EYES: Pupils equal, round, reactive to light and accommodation. No scleral icterus. Extraocular muscles intact. Pallor present.  HEENT: Head atraumatic, normocephalic. Oropharynx and nasopharynx clear.  NECK:  Supple, no jugular venous distention. No thyroid enlargement, no tenderness.  LUNGS: Normal breath sounds bilaterally, no wheezing, rales,rhonchi or crepitation. No use of accessory muscles of respiration.  CARDIOVASCULAR: S1, S2 normal. No murmurs, rubs, or gallops.  ABDOMEN: Soft, nontender, nondistended. Bowel sounds present. No organomegaly or mass.  EXTREMITIES: No pedal  edema, cyanosis, or clubbing.  NEUROLOGIC: Cranial nerves II through XII are intact. Muscle strength 5/5 in all extremities. Sensation intact. Gait not checked.  PSYCHIATRIC: The patient is alert and oriented x 3.  SKIN: No obvious rash, lesion, or ulcer.   LABORATORY PANEL:   CBC Recent Labs  Lab 07/15/17 1012  WBC 6.6  HGB 8.2*  HCT 25.1*  PLT 162  MCV 95.5  MCH 31.3  MCHC 32.8  RDW 13.9  LYMPHSABS 0.6*  MONOABS 0.5  EOSABS 0.3  BASOSABS 0.1   ------------------------------------------------------------------------------------------------------------------  Chemistries  Recent Labs  Lab 07/15/17 1012  NA 138  K 4.9  CL 108  CO2 20*  GLUCOSE 95  BUN 40*  CREATININE 2.91*  CALCIUM 8.6*   ------------------------------------------------------------------------------------------------------------------ estimated creatinine clearance is 13.1 mL/min (A) (by C-G formula based on SCr of 2.91 mg/dL  (H)). ------------------------------------------------------------------------------------------------------------------ No results for input(s): TSH, T4TOTAL, T3FREE, THYROIDAB in the last 72 hours.  Invalid input(s): FREET3   Coagulation profile Recent Labs  Lab 07/15/17 1012  INR 0.97   ------------------------------------------------------------------------------------------------------------------- No results for input(s): DDIMER in the last 72 hours. -------------------------------------------------------------------------------------------------------------------  Cardiac Enzymes Recent Labs  Lab 07/15/17 1023  TROPONINI 0.03*   ------------------------------------------------------------------------------------------------------------------ Invalid input(s): POCBNP  ---------------------------------------------------------------------------------------------------------------  Urinalysis    Component Value Date/Time   COLORURINE YELLOW (A) 09/07/2016 2220   APPEARANCEUR CLEAR (A) 09/07/2016 2220   APPEARANCEUR Hazy 06/22/2011 1639   LABSPEC 1.011 09/07/2016 2220   LABSPEC 1.015 06/22/2011 1639   PHURINE 5.0 09/07/2016 2220   GLUCOSEU NEGATIVE 09/07/2016 2220   GLUCOSEU Negative 06/22/2011 1639   HGBUR NEGATIVE 09/07/2016 2220   BILIRUBINUR NEGATIVE 09/07/2016 2220   BILIRUBINUR Negative 06/22/2011 1639   KETONESUR NEGATIVE 09/07/2016 2220   PROTEINUR 100 (A) 09/07/2016 2220   NITRITE NEGATIVE 09/07/2016 2220   LEUKOCYTESUR NEGATIVE 09/07/2016 2220   LEUKOCYTESUR Negative 06/22/2011 1639     RADIOLOGY: No results found.  EKG: Orders placed or performed during the hospital encounter of 07/15/17  . ED EKG  . ED EKG  . EKG 12-Lead  . EKG 12-Lead    IMPRESSION AND PLAN:  84 year old elderly female patient with history of atrial fibrillation, chronic kidney disease, CVA, gout, hyperlipidemia, hypertension, emphysema presented to the emergency room for  weakness, fatigue.  1 symptomatic anemia. Check hemoglobin hematocrit frequently Transfuse PRBC IV if hemoglobin less than 7  2.  Anemia probably secondary to GI loss Gastroenterology consultation  hold aspirin  3.  Chronic kidney disease Monitor renal function  4 atrial fibrillation. Continue Norvasc and metoprolol for rate control  5 DVT prophylaxis sequential compression device to lower extremities.   All the records are reviewed and case discussed with ED provider. Management plans discussed with the patient, family and they are in agreement.  CODE STATUS:FULL CODE Code Status History    This patient does not have a recorded code status. Please follow your organizational policy for patients in this situation.       TOTAL TIME TAKING CARE OF THIS PATIENT: 50 minutes.    Saundra Shelling M.D on 07/15/2017 at 11:52 AM  Between 7am to 6pm - Pager - [phone]  After 6pm go to [email or web address] - password [email or web address] [facility]  [place] [facility]  Office  [phone]  CC: Primary care physician; [facility]

## 2017-07-15 NOTE — Progress Notes (Signed)
Chaplain met with the patient and was informed that she did not want an AD, as God has more work for her to do. Chaplain spoke to the patient's nurse and asked to be paged when her children are present. Chaplain will attempt to provide AD education when they are present.

## 2017-07-15 NOTE — ED Triage Notes (Signed)
Pt states she was seen by her PCP at [facility] primary yesterday for possible anemia with a Hx, states they called her back to come to the ED for a transfusion.. Pt states her only sx is dizziness. Pt is hard of hearing.Marland Kitchen

## 2017-07-15 NOTE — ED Notes (Signed)
ED Provider at bedside. 

## 2017-07-15 NOTE — Consult Note (Signed)
Kathryn Lame, MD [facility]  [address], [place], [place] [address] Phone: [phone] Fax : [phone]  Consultation  Referring Provider:     Dr. Estanislado Pandy Primary Care Physician: [facility] Primary Gastroenterologist:  Dr. Vira Agar        Reason for Consultation:     Anemia  Date of Admission:  07/15/2017 Date of Consultation:  07/15/2017         HPI:   Kathryn Cain is a 84 y.o. female who has a known history of atrial fibrillation breast cancer diverticulosis COPD and peripheral vascular disease.  The patient has had a low hemoglobin and has had transfusions in the past.  The patient is very hard of hearing and hard to get any information from her.  She does report that she had a colonoscopy approximately 5 years ago and she states at that time she had some polyps.  She believes that Dr. Vira Agar did that procedure.  From reviewing her chart the last time I can see that she saw Dr. Vira Agar was in February 2009.  The patient had seen Dr. Minna Merritts in 2013.  It appears that he try to do a colonoscopy on her but there was a poor prep but he did report left-sided diverticulosis without any active bleeding.  The patient has a normal MCV and RDW.  The patient also has chronic kidney disease.  I am now being asked to see the patient for her anemia.  Patient also denies any black stools or bloody stools.  She also denies any abdominal pain nausea vomiting fevers or chills.  Past Medical History:  Diagnosis Date  . A-fib ([place])   . Anginal pain ([place])   . Arthritis   . Cancer (HCC)    BREAST  . Chronic renal insufficiency   . COPD (chronic obstructive pulmonary disease) ([place])   . Coronary artery disease   . Depression   . Diverticulosis   . Gout   . HOH (hard of hearing)   . Hyperlipidemia   . Hypertension   . Myocardial infarction ([place])   . Nephrolithiasis   . Peripheral vascular disease ([place])   . Stroke [facility])    2013    Past Surgical History:  Procedure Laterality  Date  . AMPUTATION Right 04/02/2016   Procedure: AMPUTATION FINGER TIP;  Surgeon: Hessie Knows, MD;  Location: [facility] ORS;  Service: Orthopedics;  Laterality: Right;  . APPENDECTOMY    . BREAST SURGERY Right   . CARDIAC SURGERY    . CATARACT EXTRACTION W/PHACO Right 07/08/2017   Procedure: CATARACT EXTRACTION PHACO AND INTRAOCULAR LENS PLACEMENT (IOC);  Surgeon: Eulogio Bear, MD;  Location: [facility] ORS;  Service: Ophthalmology;  Laterality: Right;  fluid lot #[id number] H  exp 02/01/2019 [place]   00:32.2 AP%   12.7 CDE  4.08   . CORONARY ARTERY BYPASS GRAFT     2013  . DISTAL INTERPHALANGEAL JOINT FUSION Right 11/28/2015   Procedure: DISTAL INTERPHALANGEAL JOINT FUSION;  Surgeon: Hessie Knows, MD;  Location: [facility] ORS;  Service: Orthopedics;  Laterality: Right;  third finger  . KIDNEY STONE SURGERY    . MASTECTOMY Right    1986    Prior to Admission medications   Medication Sig Start Date End Date Taking? Authorizing Provider  allopurinol (ZYLOPRIM) 100 MG tablet Take 150 mg by mouth daily.   Yes [provider]  amLODipine (NORVASC) 5 MG tablet Take 5 mg by mouth daily.    Yes [provider]  aspirin 81 MG tablet Take 81 mg by mouth daily.   Yes [provider]  citalopram (CELEXA) 20 MG tablet Take 20 mg by mouth daily.   Yes [provider]  diphenhydramine-acetaminophen (TYLENOL PM) 25-500 MG TABS tablet Take 1 tablet by mouth at bedtime as needed (for sleep).    Yes [provider]  DUREZOL 0.05 % EMUL Apply 1 drop to eye 4 (four) times daily. Begin after surgery (07/08/2017)   Yes Eulogio Bear, MD  furosemide (LASIX) 40 MG tablet Take 40 mg by mouth daily as needed for fluid.    Yes [provider]  Iron-Vitamins (GERITOL PO) Take 1 tablet by mouth daily.    Yes [provider]  metoprolol tartrate (LOPRESSOR) 25 MG tablet Take 25 mg by mouth daily.    Yes [provider]  Omega-3 Fatty Acids (FISH OIL PO) Take 1  capsule by mouth daily.   Yes [provider]  potassium chloride SA (K-DUR,KLOR-CON) 20 MEQ tablet Take 20 mEq by mouth daily.   Yes [provider]  pravastatin (PRAVACHOL) 80 MG tablet Take 80 mg by mouth at bedtime.    Yes [provider]  PROAIR HFA 108 (90 Base) MCG/ACT inhaler Inhale 2 puffs into the lungs every 6 (six) hours as needed for shortness of breath or wheezing. 05/28/17  Yes [provider]  traMADol (ULTRAM) 50 MG tablet Take by mouth every 8 (eight) hours as needed.   Yes [provider]  traZODone (DESYREL) 50 MG tablet Take 50 mg by mouth at bedtime.   Yes [provider]  HYDROcodone-acetaminophen (NORCO) 5-325 MG tablet Take 1 tablet by mouth every 6 (six) hours as needed for moderate pain. Patient not taking: Reported on 04/02/2016 11/28/15   Hessie Knows, MD  oxyCODONE-acetaminophen (PERCOCET) 5-325 MG tablet Take 2 tablets by mouth every 6 (six) hours as needed for moderate pain or severe pain. Patient not taking: Reported on 06/30/2017 03/28/16   Earleen Newport, MD    No family history on file.   Social History   Tobacco Use  . Smoking status: Current Some Day Smoker    Packs/day: 0.50    Types: Cigarettes  . Smokeless tobacco: Never Used  Substance Use Topics  . Alcohol use: No  . Drug use: No    Allergies as of 07/15/2017 - Review Complete 07/15/2017  Allergen Reaction Noted  . Amiodarone Itching 06/30/2017  . Ciprofloxacin hcl Other (See Comments) 11/21/2015  . Codeine Itching 09/04/2015  . Eliquis [apixaban]  07/05/2017    Review of Systems:    All systems reviewed and negative except where noted in HPI.   Physical Exam:  Vital signs in last 24 hours: Temp:  [98.2 F (36.8 C)-98.6 F (37 C)] 98.4 F (36.9 C) (03/14 1422) Pulse Rate:  [83-108] 95 (03/14 1422) Resp:  [16] 16 (03/14 0926) BP: (137-216)/(70-92) 137/70 (03/14 1422) SpO2:  [100 %] 100 % (03/14 1422) Weight:  [140 lb  (63.5 kg)] 140 lb (63.5 kg) (03/14 0927) Last BM Date: 07/14/17 General:   Pleasant, cooperative in NAD Head:  Normocephalic and atraumatic. Eyes:   No icterus.   Conjunctiva pink. PERRLA. Ears:  Normal auditory acuity. Neck:  Supple; no masses or thyroidomegaly Lungs: Respirations even and unlabored. Lungs clear to auscultation bilaterally.   No wheezes, crackles, or rhonchi.  Heart:  Regular rate and rhythm;  Without murmur, clicks, rubs or gallops Abdomen:  Soft, nondistended, nontender. Normal bowel sounds. No  appreciable masses or hepatomegaly.  No rebound or guarding.  Rectal:  Not performed. Msk:  Symmetrical without gross deformities.    Extremities:  Without edema, cyanosis or clubbing. Neurologic:  Alert and oriented x3;  grossly normal neurologically. Skin:  Intact without significant lesions or rashes. Cervical Nodes:  No significant cervical adenopathy. Psych:  Alert and cooperative. Normal affect.  LAB RESULTS: Recent Labs    07/15/17 1012  WBC 6.6  HGB 8.2*  HCT 25.1*  PLT 162   BMET Recent Labs    07/15/17 1012  NA 138  K 4.9  CL 108  CO2 20*  GLUCOSE 95  BUN 40*  CREATININE 2.91*  CALCIUM 8.6*   LFT No results for input(s): PROT, ALBUMIN, AST, ALT, ALKPHOS, BILITOT, BILIDIR, IBILI in the last 72 hours. PT/INR Recent Labs    07/15/17 1012  LABPROT 12.8  INR 0.97    STUDIES: No results found.    Impression / Plan:   Kathryn Cain is a 84 y.o. y/o female with some somatic anemia.  The patient has had a drop in her hemoglobin.  The patient denies any overt sign of GI bleeding.  The patient will be set up for an EGD and colonoscopy for tomorrow.  She will be prepped tonight. I have discussed risks & benefits which include, but are not limited to, bleeding, infection, perforation & drug reaction.  The patient agrees with this plan & written consent will be obtained.     Thank you for involving me in the care of this patient.      LOS: 0  days   Kathryn Lame, MD  07/15/2017, 2:56 PM   Note: This dictation was prepared with Dragon dictation along with smaller phrase technology. Any transcriptional errors that result from this process are unintentional.

## 2017-07-15 NOTE — Progress Notes (Signed)
Advanced care plan.  Purpose of the Encounter: CODE STATUS  Parties in Attendance: Patient and Patients son  Patient's Decision Capacity: Good  Subjective/Patient's story: Presented with weakness and fatigue. Has low hemoglobin.   Objective/Medical story Admitted for anemia, dizziness and fatigue. GI work up.   Goals of care determination:  Cardiac resuscitation and mechanical ventilation if need arises. Patient and family (son ) want everything done. Patient is full code   CODE STATUS: FULL CODE   Time spent discussing advanced care planning: 16 minutes

## 2017-07-15 NOTE — Progress Notes (Signed)
Chaplain spoke with the patient, niece, and son about the AD. Patient was resistant. The son received the information but is doubtful that the patient will proceed. OR is closed.

## 2017-07-15 NOTE — ED Notes (Addendum)
Pt up to bedside commode for urine specimen with no success, will try again
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
## 2017-07-15 NOTE — ED Notes (Signed)
First Nurse Note:  Call received from [facility] - Dr. Devona Konig office.  Patient has Hct. 26.2.  Office reports patient has anemia and lethargy.  Patient in to ED with son via [place].  Alert and oriented.

## 2017-07-16 ENCOUNTER — Inpatient Hospital Stay: Payer: Medicare HMO | Admitting: Anesthesiology

## 2017-07-16 ENCOUNTER — Encounter: Payer: Self-pay | Admitting: Anesthesiology

## 2017-07-16 ENCOUNTER — Encounter
Admission: EM | Disposition: A | Discharge status: Home / Self Care | Payer: Self-pay | Source: Home / Self Care | Attending: Internal Medicine

## 2017-07-16 DIAGNOSIS — K2970 Gastritis, unspecified, without bleeding: Secondary | ICD-10-CM

## 2017-07-16 DIAGNOSIS — D124 Benign neoplasm of descending colon: Secondary | ICD-10-CM

## 2017-07-16 DIAGNOSIS — K2980 Duodenitis without bleeding: Secondary | ICD-10-CM

## 2017-07-16 HISTORY — PX: ESOPHAGOGASTRODUODENOSCOPY (EGD) WITH PROPOFOL: SHX5813

## 2017-07-16 HISTORY — PX: COLONOSCOPY WITH PROPOFOL: SHX5780

## 2017-07-16 LAB — BASIC METABOLIC PANEL
Anion gap: 9 (ref 5–15)
BUN: 34 mg/dL — ABNORMAL HIGH (ref 6–20)
CHLORIDE: 112 mmol/L — AB (ref 101–111)
CO2: 19 mmol/L — ABNORMAL LOW (ref 22–32)
CREATININE: 2.75 mg/dL — AB (ref 0.44–1.00)
Calcium: 8.2 mg/dL — ABNORMAL LOW (ref 8.9–10.3)
GFR calc non Af Amer: 15 mL/min — ABNORMAL LOW (ref >60)
GFR, EST AFRICAN AMERICAN: 18 mL/min — AB (ref >60)
Glucose, Bld: 94 mg/dL (ref 65–99)
POTASSIUM: 4.7 mmol/L (ref 3.5–5.1)
SODIUM: 140 mmol/L (ref 135–145)

## 2017-07-16 LAB — IRON AND TIBC
Iron: 41 ug/dL (ref 28–170)
Saturation Ratios: 25 % (ref 10.4–31.8)
TIBC: 167 ug/dL — ABNORMAL LOW (ref 250–450)
UIBC: 126 ug/dL

## 2017-07-16 LAB — HEMOGLOBIN AND HEMATOCRIT, BLOOD
HCT: 22 % — ABNORMAL LOW (ref 35.0–47.0)
HCT: 23.8 % — ABNORMAL LOW (ref 35.0–47.0)
HEMATOCRIT: 21.6 % — AB (ref 35.0–47.0)
HEMOGLOBIN: 7 g/dL — AB (ref 12.0–16.0)
Hemoglobin: 7.2 g/dL — ABNORMAL LOW (ref 12.0–16.0)
Hemoglobin: 7.8 g/dL — ABNORMAL LOW (ref 12.0–16.0)

## 2017-07-16 LAB — RETICULOCYTES
RBC.: 2.51 MIL/uL — AB (ref 3.80–5.20)
RETIC COUNT ABSOLUTE: 47.7 10*3/uL (ref 19.0–183.0)
RETIC CT PCT: 1.9 % (ref 0.4–3.1)

## 2017-07-16 LAB — PREPARE RBC (CROSSMATCH)

## 2017-07-16 LAB — FERRITIN: Ferritin: 322 ng/mL — ABNORMAL HIGH (ref 11–307)

## 2017-07-16 LAB — VITAMIN B12: Vitamin B-12: 490 pg/mL (ref 180–914)

## 2017-07-16 LAB — ABO/RH: ABO/RH(D): O POS

## 2017-07-16 LAB — FOLATE: FOLATE: 32 ng/mL (ref >5.9)

## 2017-07-16 SURGERY — ESOPHAGOGASTRODUODENOSCOPY (EGD) WITH PROPOFOL
Anesthesia: General

## 2017-07-16 MED ORDER — PNEUMOCOCCAL VAC POLYVALENT 25 MCG/0.5ML IJ INJ
0.5000 mL | INJECTION | INTRAMUSCULAR | Status: DC
  Filled 2017-07-16: qty 0.5

## 2017-07-16 MED ORDER — PROPOFOL 10 MG/ML IV BOLUS
INTRAVENOUS | Status: DC | PRN
  Administered 2017-07-16 (×3): 20 mg via INTRAVENOUS
  Administered 2017-07-16: 40 mg via INTRAVENOUS

## 2017-07-16 MED ORDER — PROPOFOL 500 MG/50ML IV EMUL
INTRAVENOUS | Status: AC
Start: 2017-07-16 — End: ?
  Filled 2017-07-16: qty 50

## 2017-07-16 MED ORDER — LIDOCAINE HCL (PF) 2 % IJ SOLN
INTRAMUSCULAR | Status: AC
Start: 2017-07-16 — End: ?
  Filled 2017-07-16: qty 10

## 2017-07-16 MED ORDER — ZOLPIDEM TARTRATE 5 MG PO TABS
5.0000 mg | ORAL_TABLET | Freq: Every evening | ORAL | Status: DC | PRN
  Administered 2017-07-16 (×2): 5 mg via ORAL
  Filled 2017-07-16 (×2): qty 1

## 2017-07-16 MED ORDER — SODIUM CHLORIDE 0.9 % IV SOLN
INTRAVENOUS | Status: DC
  Administered 2017-07-16: 1000 mL via INTRAVENOUS
  Administered 2017-07-16: 12:00:00 via INTRAVENOUS

## 2017-07-16 MED ORDER — LIDOCAINE HCL (PF) 2 % IJ SOLN
INTRAMUSCULAR | Status: DC | PRN
  Administered 2017-07-16: 60 mg

## 2017-07-16 MED ORDER — SODIUM CHLORIDE 0.9 % IV SOLN: Freq: Once | INTRAVENOUS | Status: DC

## 2017-07-16 MED ORDER — PROPOFOL 500 MG/50ML IV EMUL
INTRAVENOUS | Status: DC | PRN
  Administered 2017-07-16: 100 ug/kg/min via INTRAVENOUS

## 2017-07-16 NOTE — Plan of Care (Signed)
  Progressing Education: Knowledge of General Education information will improve 07/16/2017 1514 - Progressing by Daylene Posey, RN Health Behavior/Discharge Planning: Ability to manage health-related needs will improve 07/16/2017 1514 - Progressing by Daylene Posey, RN Clinical Measurements: Ability to maintain clinical measurements within normal limits will improve 07/16/2017 1514 - Progressing by Daylene Posey, RN Will remain free from infection 07/16/2017 1514 - Progressing by Daylene Posey, RN Diagnostic test results will improve 07/16/2017 1514 - Progressing by Daylene Posey, RN Respiratory complications will improve 07/16/2017 1514 - Progressing by Daylene Posey, RN Cardiovascular complication will be avoided 07/16/2017 1514 - Progressing by Daylene Posey, RN Activity: Risk for activity intolerance will decrease 07/16/2017 1514 - Progressing by Daylene Posey, RN Nutrition: Adequate nutrition will be maintained 07/16/2017 1514 - Progressing by Daylene Posey, RN Coping: Level of anxiety will decrease 07/16/2017 1514 - Progressing by Daylene Posey, RN Elimination: Will not experience complications related to bowel motility 07/16/2017 1514 - Progressing by Daylene Posey, RN Will not experience complications related to urinary retention 07/16/2017 1514 - Progressing by Daylene Posey, RN Pain Managment: General experience of comfort will improve 07/16/2017 1514 - Progressing by Daylene Posey, RN Safety: Ability to remain free from injury will improve 07/16/2017 1514 - Progressing by Daylene Posey, RN Skin Integrity: Risk for impaired skin integrity will decrease 07/16/2017 1514 - Progressing by Daylene Posey, RN Spiritual Needs Ability to function at adequate level 07/16/2017 1514 - Progressing by Daylene Posey, RN

## 2017-07-16 NOTE — Progress Notes (Signed)
[place] at [facility]                                                                                                                                                                                  Patient Demographics   Kathryn Cain, is a 84 y.o. female, DOB - 11/19/1933, ZDG:[id number]  Admit date - 07/15/2017   Admitting Physician Saundra Shelling, MD  Outpatient Primary MD for the patient is [name], MD   LOS - 1  Subjective: Patient admitted with generalized weakness noted to have's anemia which has gotten worse and overnight awaiting colonoscopy and EGD    Review of Systems:   CONSTITUTIONAL: No documented fever.  Positive fatigue, positive weakness. No weight gain, no weight loss.  EYES: No blurry or double vision.  ENT: No tinnitus. No postnasal drip. No redness of the oropharynx.  RESPIRATORY: No cough, no wheeze, no hemoptysis. No dyspnea.  CARDIOVASCULAR: No chest pain. No orthopnea. No palpitations. No syncope.  GASTROINTESTINAL: No nausea, no vomiting or diarrhea. No abdominal pain. No melena or hematochezia.  GENITOURINARY: No dysuria or hematuria.  ENDOCRINE: No polyuria or nocturia. No heat or cold intolerance.  HEMATOLOGY: No anemia. No bruising. No bleeding.  INTEGUMENTARY: No rashes. No lesions.  MUSCULOSKELETAL: No arthritis. No swelling. No gout.  NEUROLOGIC: No numbness, tingling, or ataxia. No seizure-type activity.  PSYCHIATRIC: No anxiety. No insomnia. No ADD.    Vitals:   Vitals:   07/16/17 1342 07/16/17 1421 07/16/17 1448 07/16/17 1520  BP: (!) 166/93 (!) 183/76 (!) 178/77   Pulse: 93 84 81   Resp: 19 20 20    Temp:  98.6 F (37 C) 98.6 F (37 C)   TempSrc:  Oral Oral Oral  SpO2: 98% 100% 100%   Weight:      Height:        Wt Readings from Last 3 Encounters:  07/15/17 140 lb (63.5 kg)  07/05/17 143 lb (64.9 kg)  10/12/16 143 lb (64.9 kg)     Intake/Output Summary (Last 24 hours) at 07/16/2017  1522 Last data filed at 07/16/2017 1400 Gross per 24 hour  Intake 3821.25 ml  Output 875 ml  Net 2946.25 ml    Physical Exam:   GENERAL: Pleasant-appearing in no apparent distress.  HEAD, EYES, EARS, NOSE AND THROAT: Atraumatic, normocephalic. Extraocular muscles are intact. Pupils equal and reactive to light. Sclerae anicteric. No conjunctival injection. No oro-pharyngeal erythema.  NECK: Supple. There is no jugular venous distention. No bruits, no lymphadenopathy, no thyromegaly.  HEART: Regular rate and rhythm,. No murmurs, no rubs, no clicks.  LUNGS: Clear to auscultation bilaterally. No rales or rhonchi. No wheezes.  ABDOMEN: Soft, flat, nontender, nondistended. Has good bowel sounds. No hepatosplenomegaly appreciated.  EXTREMITIES: No evidence of any cyanosis, clubbing, or peripheral edema.  +2 pedal and radial pulses bilaterally.  NEUROLOGIC: The patient is alert, awake, and oriented x3 with no focal motor or sensory deficits appreciated bilaterally.  SKIN: Moist and warm with no rashes appreciated.  Psych: Not anxious, depressed LN: No inguinal LN enlargement    Antibiotics   Anti-infectives (From admission, onward)   None      Medications   Scheduled Meds: . amLODipine  5 mg Oral Daily  . metoprolol tartrate  25 mg Oral Daily  . pantoprazole (PROTONIX) IV  40 mg Intravenous Q12H  . [START ON 07/17/2017] pneumococcal 23 valent vaccine  0.5 mL Intramuscular Tomorrow-1000   Continuous Infusions: . sodium chloride 75 mL/hr at 07/16/17 0118  . sodium chloride Stopped (07/16/17 1417)   PRN Meds:.acetaminophen **OR** acetaminophen, albuterol, hydrALAZINE, ondansetron **OR** ondansetron (ZOFRAN) IV, zolpidem   Data Review:   Micro Results No results found for this or any previous visit (from the past 240 hour(s)).  Radiology Reports No results found.   CBC Recent Labs  Lab 07/15/17 1012 07/15/17 1613 07/16/17 0002 07/16/17 0625 07/16/17 0951  WBC 6.6  --    --   --   --   HGB 8.2* 7.6* 7.8* 7.2* 7.0*  HCT 25.1* 23.4* 23.8* 22.0* 21.6*  PLT 162  --   --   --   --   MCV 95.5  --   --   --   --   MCH 31.3  --   --   --   --   MCHC 32.8  --   --   --   --   RDW 13.9  --   --   --   --   LYMPHSABS 0.6*  --   --   --   --   MONOABS 0.5  --   --   --   --   EOSABS 0.3  --   --   --   --   BASOSABS 0.1  --   --   --   --     Chemistries  Recent Labs  Lab 07/15/17 1012 07/16/17 0625  NA 138 140  K 4.9 4.7  CL 108 112*  CO2 20* 19*  GLUCOSE 95 94  BUN 40* 34*  CREATININE 2.91* 2.75*  CALCIUM 8.6* 8.2*   ------------------------------------------------------------------------------------------------------------------ estimated creatinine clearance is 13.9 mL/min (A) (by C-G formula based on SCr of 2.75 mg/dL (H)). ------------------------------------------------------------------------------------------------------------------ No results for input(s): HGBA1C in the last 72 hours. ------------------------------------------------------------------------------------------------------------------ No results for input(s): CHOL, HDL, LDLCALC, TRIG, CHOLHDL, LDLDIRECT in the last 72 hours. ------------------------------------------------------------------------------------------------------------------ No results for input(s): TSH, T4TOTAL, T3FREE, THYROIDAB in the last 72 hours.  Invalid input(s): FREET3 ------------------------------------------------------------------------------------------------------------------ Recent Labs    07/16/17 0002  VITAMINB12 490  FOLATE 32.0  FERRITIN 322*  TIBC 167*  IRON 41  RETICCTPCT 1.9    Coagulation profile Recent Labs  Lab 07/15/17 1012  INR 0.97    No results for input(s): DDIMER in the last 72 hours.  Cardiac Enzymes Recent Labs  Lab 07/15/17 1023  TROPONINI 0.03*    ------------------------------------------------------------------------------------------------------------------ Invalid input(s): POCBNP    Assessment & Plan   A31 year old elderly female patient with history of atrial fibrillation, chronic kidney disease, CVA, gout, hyperlipidemia, hypertension, emphysema presented to the emergency room for weakness, fatigue.  1 symptomatic anemia. Hemoglobin now close to 7 due to  her symptoms I have discussed with her regarding transfusion patient is agreeable to 1 unit of packed RBCs Repeat CBC in the morning  2.  Anemia probably secondary to GI loss Appreciate gastroenterology input Colonoscopy and endoscopy later today  3.  Chronic kidney disease Monitor renal function  4 atrial fibrillation. Continue Norvasc and metoprolol for rate control  5 DVT prophylaxis sequential compression device to lower extremities  We will also obtain PT evaluation     Code Status Orders  (From admission, onward)        Start     Ordered   07/15/17 1225  Full code  Continuous     07/15/17 1224    Code Status History    Date Active Date Inactive Code Status Order ID Comments User Context   This patient has a current code status but no historical code status.           Consults gi  DVT Prophylaxis  scd's  Lab Results  Component Value Date   PLT 162 07/15/2017     Time Spent in minutes  53min Greater than 50% of time spent in care coordination and counseling patient regarding the condition and plan of care.   Dustin Flock M.D on 07/16/2017 at 3:22 PM  Between 7am to 6pm - Pager - [phone]  After 6pm go to [email or web address] - Proofreader  [employer]   Office  [phone]

## 2017-07-16 NOTE — Anesthesia Postprocedure Evaluation (Signed)
Anesthesia Post Note  Patient: Kathryn Cain  Procedure(s) Performed: ESOPHAGOGASTRODUODENOSCOPY (EGD) WITH PROPOFOL (N/A ) COLONOSCOPY WITH PROPOFOL (N/A )  Patient location during evaluation: Endoscopy Anesthesia Type: General Level of consciousness: awake and alert Pain management: pain level controlled Vital Signs Assessment: post-procedure vital signs reviewed and stable Respiratory status: spontaneous breathing, nonlabored ventilation and respiratory function stable Cardiovascular status: blood pressure returned to baseline and stable Postop Assessment: no apparent nausea or vomiting Anesthetic complications: no     Last Vitals:  Vitals:   07/16/17 1421 07/16/17 1448  BP: (!) 183/76 (!) 178/77  Pulse: 84 81  Resp: 20 20  Temp: 37 C 37 C  SpO2: 100% 100%    Last Pain:  Vitals:   07/16/17 1448  TempSrc: Oral  PainSc:                  Alphonsus Sias

## 2017-07-16 NOTE — Progress Notes (Signed)
Patient consumed 2,021mL of bowel prep. Had multiple large stools including incontinence with watery, clear-brownish waste. Patient became nauseated from consumption of bowel prep. Patient could no longer tolerate any additional prep. Zofran was administered, but patient vomited a large amount of clear liquid. After several watery bowel movements and vomit, patient's nausea and "sick" feeling subsided. She refused any additional prep.

## 2017-07-16 NOTE — Op Note (Signed)
[facility] Gastroenterology Patient Name: Kathryn Cain Procedure Date: 07/16/2017 12:21 PM MRN: [id number] Account #: [id number] Date of Birth: 1933/12/21 Admit Type: Inpatient Age: 84 Room: [facility] ENDO ROOM 3 Gender: Female Note Status: Finalized Procedure:            Colonoscopy Indications:          Iron deficiency anemia Providers:            [name] MD, MD Medicines:            Propofol per Anesthesia Complications:        No immediate complications. Procedure:            Pre-Anesthesia Assessment:                       - Prior to the procedure, a History and Physical was                        performed, and patient medications and allergies were                        reviewed. The patient's tolerance of previous                        anesthesia was also reviewed. The risks and benefits of                        the procedure and the sedation options and risks were                        discussed with the patient. All questions were                        answered, and informed consent was obtained. Prior                        Anticoagulants: The patient has taken no previous                        anticoagulant or antiplatelet agents. ASA Grade                        Assessment: II - A patient with mild systemic disease.                        After reviewing the risks and benefits, the patient was                        deemed in satisfactory condition to undergo the                        procedure.                       After obtaining informed consent, the colonoscope was                        passed under direct vision. Throughout the procedure,                        the patient's blood pressure, pulse, and  oxygen                        saturations were monitored continuously. The                        Colonoscope was introduced through the anus and                        advanced to the the cecum, identified by appendiceal           orifice and ileocecal valve. The colonoscopy was                        performed without difficulty. The patient tolerated the                        procedure well. The quality of the bowel preparation                        was poor. Findings:      The perianal and digital rectal examinations were normal.      Two sessile polyps were found in the descending colon. The polyps were 6       to 8 mm in size. These polyps were removed with a cold snare. Resection       and retrieval were complete.      Multiple small-mouthed diverticula were found in the entire colon.      Non-bleeding internal hemorrhoids were found during retroflexion. The       hemorrhoids were Grade II (internal hemorrhoids that prolapse but reduce       spontaneously). Impression:           - Preparation of the colon was poor.                       - Two 6 to 8 mm polyps in the descending colon, removed                        with a cold snare. Resected and retrieved.                       - Diverticulosis in the entire examined colon.                       - Non-bleeding internal hemorrhoids. Recommendation:       - Discharge patient to home.                       - Resume previous diet.                       - Continue present medications.                       - Await pathology results. Procedure Code(s):    --- Professional ---                       [phone], Colonoscopy, flexible; with removal of tumor(s),                        polyp(s), or other lesion(s) by snare technique Diagnosis  Code(s):    --- Professional ---                       D50.9, Iron deficiency anemia, unspecified                       D12.4, Benign neoplasm of descending colon CPT copyright 2016 American Medical Association. All rights reserved. The codes documented in this report are preliminary and upon coder review may  be revised to meet current compliance requirements. [name] MD, MD 07/16/2017 1:07:34 PM This report has been  signed electronically. Number of Addenda: 0 Note Initiated On: 07/16/2017 12:21 PM Scope Withdrawal Time: 0 hours 5 minutes 31 seconds  Total Procedure Duration: 0 hours 16 minutes 50 seconds       [facility]

## 2017-07-16 NOTE — Plan of Care (Signed)
  Progressing Education: Knowledge of General Education information will improve 07/16/2017 0222 - Progressing by Johnna Acosta, RN Health Behavior/Discharge Planning: Ability to manage health-related needs will improve 07/16/2017 0222 - Progressing by Johnna Acosta, RN Activity: Risk for activity intolerance will decrease 07/16/2017 0222 - Progressing by Johnna Acosta, RN Coping: Level of anxiety will decrease 07/16/2017 0222 - Progressing by Johnna Acosta, RN Elimination: Will not experience complications related to bowel motility 07/16/2017 0222 - Progressing by Johnna Acosta, RN Will not experience complications related to urinary retention 07/16/2017 0222 - Progressing by Johnna Acosta, RN Pain Managment: General experience of comfort will improve 07/16/2017 0222 - Progressing by Johnna Acosta, RN Safety: Ability to remain free from injury will improve 07/16/2017 0222 - Progressing by Johnna Acosta, RN Skin Integrity: Risk for impaired skin integrity will decrease 07/16/2017 0222 - Progressing by Johnna Acosta, RN

## 2017-07-16 NOTE — Anesthesia Post-op Follow-up Note (Signed)
Anesthesia QCDR form completed.        

## 2017-07-16 NOTE — Anesthesia Preprocedure Evaluation (Signed)
Anesthesia Evaluation  Patient identified by MRN, date of birth, ID band Patient awake    Reviewed: Allergy & Precautions, NPO status , Patient's Chart, lab work & pertinent test results  History of Anesthesia Complications Negative for: history of anesthetic complications  Airway Mallampati: II  TM Distance: >3 FB Neck ROM: Full    Dental  (+) Upper Dentures, Lower Dentures   Pulmonary neg sleep apnea, COPD, Current Smoker,    breath sounds clear to auscultation- rhonchi (-) wheezing      Cardiovascular hypertension, (-) angina+ CAD, + Past MI, + CABG and + Peripheral Vascular Disease  + dysrhythmias (paroxysmal) Atrial Fibrillation  Rhythm:Regular Rate:Normal - Systolic murmurs and - Diastolic murmurs    Neuro/Psych PSYCHIATRIC DISORDERS Depression CVA, No Residual Symptoms negative psych ROS   GI/Hepatic negative GI ROS, Neg liver ROS,   Endo/Other  negative endocrine ROSneg diabetes  Renal/GU CRFRenal disease     Musculoskeletal  (+) Arthritis ,   Abdominal (+) - obese,   Peds  Hematology negative hematology ROS (+)   Anesthesia Other Findings Past Medical History: No date: A-fib (HCC) No date: Anginal pain (HCC) No date: Arthritis No date: Chronic renal insufficiency No date: COPD (chronic obstructive pulmonary disease) (* No date: Coronary artery disease No date: Diverticulosis No date: Gout No date: Hyperlipidemia No date: Hypertension No date: Myocardial infarction No date: Nephrolithiasis No date: Peripheral vascular disease (HCC) No date: Stroke [facility])   Reproductive/Obstetrics                             Anesthesia Physical  Anesthesia Plan  ASA: III  Anesthesia Plan: General   Post-op Pain Management:    Induction: Intravenous  PONV Risk Score and Plan: 2 and Propofol infusion  Airway Management Planned: Natural Airway and Nasal Cannula  Additional  Equipment:   Intra-op Plan:   Post-operative Plan:   Informed Consent: I have reviewed the patients History and Physical, chart, labs and discussed the procedure including the risks, benefits and alternatives for the proposed anesthesia with the patient or authorized representative who has indicated his/her understanding and acceptance.   Dental Advisory Given  Plan Discussed with: Anesthesiologist, CRNA and Surgeon  Anesthesia Plan Comments: (Patient and son consented for risks of anesthesia including but not limited to:  - adverse reactions to medications - damage to teeth, lips or other oral mucosa - sore throat or hoarseness - Damage to heart, brain, lungs or loss of life  They voiced understanding.)        Anesthesia Quick Evaluation

## 2017-07-16 NOTE — Transfer of Care (Signed)
Immediate Anesthesia Transfer of Care Note  Patient: Kathryn Cain  Procedure(s) Performed: ESOPHAGOGASTRODUODENOSCOPY (EGD) WITH PROPOFOL (N/A ) COLONOSCOPY WITH PROPOFOL (N/A )  Patient Location: PACU and Endoscopy Unit  Anesthesia Type:General  Level of Consciousness: awake  Airway & Oxygen Therapy: Patient Spontanous Breathing  Post-op Assessment: Report given to RN  Post vital signs: stable  Last Vitals:  Vitals:   07/16/17 0417 07/16/17 1100  BP: (!) 158/73 (!) 168/76  Pulse: 94 88  Resp: 18 18  Temp: 37.1 C 37.3 C  SpO2: 100% 100%    Last Pain:  Vitals:   07/16/17 1100  TempSrc: Tympanic  PainSc:       Patients Stated Pain Goal: 0 (05/25/22 1146)  Complications: No apparent anesthesia complications

## 2017-07-16 NOTE — Progress Notes (Signed)
Contacted hospitalist via text page r/t patient request for sleep medication. See new orders

## 2017-07-16 NOTE — Op Note (Signed)
[facility] Gastroenterology Patient Name: Kathryn Cain Procedure Date: 07/16/2017 12:22 PM MRN: [id number] Account #: [id number] Date of Birth: May 21, 1933 Admit Type: Inpatient Age: 84 Room: [facility] ENDO ROOM 3 Gender: Female Note Status: Finalized Procedure:            Upper GI endoscopy Indications:          Iron deficiency anemia Providers:            [name] MD, MD Medicines:            Propofol per Anesthesia Complications:        No immediate complications. Procedure:            Pre-Anesthesia Assessment:                       - Prior to the procedure, a History and Physical was                        performed, and patient medications and allergies were                        reviewed. The patient's tolerance of previous                        anesthesia was also reviewed. The risks and benefits of                        the procedure and the sedation options and risks were                        discussed with the patient. All questions were                        answered, and informed consent was obtained. Prior                        Anticoagulants: The patient has taken no previous                        anticoagulant or antiplatelet agents. ASA Grade                        Assessment: II - A patient with mild systemic disease.                        After reviewing the risks and benefits, the patient was                        deemed in satisfactory condition to undergo the                        procedure.                       After obtaining informed consent, the endoscope was                        passed under direct vision. Throughout the procedure,                        the patient's blood pressure,  pulse, and oxygen                        saturations were monitored continuously. The Endoscope                        was introduced through the mouth, and advanced to the                        second part of duodenum. The upper GI endoscopy  was                        accomplished without difficulty. The patient tolerated                        the procedure well. Findings:      The examined esophagus was normal.      Diffuse mild inflammation characterized by erythema was found in the       entire examined stomach.      Diffuse mild inflammation characterized by erythema was found in the       duodenal bulb and in the first portion of the duodenum. Impression:           - Normal esophagus.                       - Gastritis.                       - Duodenitis.                       - No specimens collected. Recommendation:       - Return patient to hospital ward for ongoing care.                       - Resume previous diet.                       - Continue present medications.                       - Perform a colonoscopy today. Procedure Code(s):    --- Professional ---                       [phone], Esophagogastroduodenoscopy, flexible, transoral;                        diagnostic, including collection of specimen(s) by                        brushing or washing, when performed (separate procedure) Diagnosis Code(s):    --- Professional ---                       D50.9, Iron deficiency anemia, unspecified                       K29.80, Duodenitis without bleeding                       K29.70, Gastritis, unspecified, without bleeding CPT copyright 2016 American Medical Association. All rights reserved. The codes documented in this report are preliminary and  upon coder review may  be revised to meet current compliance requirements. [name] MD, MD 07/16/2017 12:47:14 PM This report has been signed electronically. Number of Addenda: 0 Note Initiated On: 07/16/2017 12:22 PM      [facility]

## 2017-07-17 LAB — CBC
HCT: 26.2 % — ABNORMAL LOW (ref 35.0–47.0)
HEMOGLOBIN: 8.7 g/dL — AB (ref 12.0–16.0)
MCH: 30.8 pg (ref 26.0–34.0)
MCHC: 33.1 g/dL (ref 32.0–36.0)
MCV: 92.9 fL (ref 80.0–100.0)
Platelets: 135 10*3/uL — ABNORMAL LOW (ref 150–440)
RBC: 2.82 MIL/uL — ABNORMAL LOW (ref 3.80–5.20)
RDW: 15.5 % — ABNORMAL HIGH (ref 11.5–14.5)
WBC: 5.5 10*3/uL (ref 3.6–11.0)

## 2017-07-17 LAB — RENAL FUNCTION PANEL
ALBUMIN: 2.8 g/dL — AB (ref 3.5–5.0)
ANION GAP: 9 (ref 5–15)
BUN: 37 mg/dL — ABNORMAL HIGH (ref 6–20)
CALCIUM: 8 mg/dL — AB (ref 8.9–10.3)
CO2: 17 mmol/L — ABNORMAL LOW (ref 22–32)
Chloride: 112 mmol/L — ABNORMAL HIGH (ref 101–111)
Creatinine, Ser: 3.04 mg/dL — ABNORMAL HIGH (ref 0.44–1.00)
GFR, EST AFRICAN AMERICAN: 16 mL/min — AB (ref >60)
GFR, EST NON AFRICAN AMERICAN: 13 mL/min — AB (ref >60)
GLUCOSE: 98 mg/dL (ref 65–99)
PHOSPHORUS: 4.1 mg/dL (ref 2.5–4.6)
Potassium: 4.5 mmol/L (ref 3.5–5.1)
SODIUM: 138 mmol/L (ref 135–145)

## 2017-07-17 MED ORDER — ALLOPURINOL 100 MG PO TABS: 100.0000 mg | ORAL_TABLET | Freq: Every day | ORAL | 0 refills | Status: DC

## 2017-07-17 MED ORDER — PANTOPRAZOLE SODIUM 40 MG PO TBEC: 40.0000 mg | DELAYED_RELEASE_TABLET | Freq: Every day | ORAL | 2 refills | Status: AC

## 2017-07-17 MED ORDER — PANTOPRAZOLE SODIUM 40 MG PO TBEC
40.0000 mg | DELAYED_RELEASE_TABLET | Freq: Every day | ORAL | Status: DC
  Administered 2017-07-17: 09:00:00 40 mg via ORAL
  Filled 2017-07-17: qty 1

## 2017-07-17 MED ORDER — AMLODIPINE BESYLATE 10 MG PO TABS
10.0000 mg | ORAL_TABLET | Freq: Every day | ORAL | Status: DC
  Administered 2017-07-17: 09:00:00 10 mg via ORAL
  Filled 2017-07-17: qty 1

## 2017-07-17 NOTE — Progress Notes (Signed)
Patient discharged with niece, IV catheter removed intact. Patient verbalized understanding of education.  Patient with no complaints.

## 2017-07-17 NOTE — Evaluation (Signed)
Physical Therapy Evaluation Patient Details Name: Kathryn Cain MRN: [id number] DOB: [birth year] Today's Date: 07/17/2017   History of Present Illness  Kathryn Cain is an 84yo black female who comes to [facility] from PCP after being found to have low HCT, EGD yesterday, mildly anemic. Baseline includes household to limitied community distance AMB with RW, lives with son who works 3rd shift. Pt is independent in ADL. PMH: GIB, duodenitis, anemia, HOH.   Clinical Impression  Pt admitted with above diagnosis. Pt currently with functional limitations due to the deficits listed below (see "PT Problem List"). Upon entry, the patient is received semirecumbent in bed. The pt is awake and agreeable to participate. No acute distress noted at this time. The pt is alert and oriented x3, pleasant, conversational, and following simple and multi-step commands consistently, bu tis VERY HOH. Funcitonal mobility assessment reveals global weakness, suspected to be minimally altered from baseline, but all in performed with modified independence to supervision level without LOB. Noted big increase in HR from supine 83BPM to AMB 123BPM. At end of AMB trial, BP: 202/174mm Hg, RN made aware. Empirically, the patient demonstrates increased risk of recurrent falls AEB gait speed <0.48m/s, forward reach <5", and multiple LOB demonstrated throughout session. Pt will benefit from skilled PT intervention to increase independence and safety with basic mobility in preparation for discharge to the venue listed below.       Follow Up Recommendations Home health PT;Supervision for mobility/OOB    Equipment Recommendations  None recommended by PT    Recommendations for Other Services       Precautions / Restrictions Precautions Precautions: Fall Restrictions Weight Bearing Restrictions: No      Mobility  Bed Mobility Overal bed mobility: Modified Independent             General bed mobility comments: additional time  and effort required  Transfers Overall transfer level: Needs assistance Equipment used: Rolling walker (2 wheeled) Transfers: Sit to/from Stand Sit to Stand: Supervision         General transfer comment: multiple attemts required, maximal effort required to come to full stance.   Ambulation/Gait Ambulation/Gait assistance: Supervision;Min guard Ambulation Distance (Feet): 200 Feet Assistive device: Rolling walker (2 wheeled) Gait Pattern/deviations: Trunk flexed Gait velocity: 0.50m/s  Gait velocity interpretation: <1.8 ft/sec, indicative of risk for recurrent falls General Gait Details: slow but no blatant gait instability nor LOB.   Stairs            Wheelchair Mobility    Modified Rankin (Stroke Patients Only)       Balance Overall balance assessment: Mild deficits observed, not formally tested;Modified Independent         Standing balance support: During functional activity;No upper extremity supported Standing balance-Leahy Scale: Good                               Pertinent Vitals/Pain Pain Assessment: No/denies pain    Home Living Family/patient expects to be discharged to:: Private residence Living Arrangements: Children Available Help at Discharge: Family Type of Home: House Home Access: Stairs to enter   Technical brewer of Steps: 1 Home Layout: One level Home Equipment: Environmental consultant - 2 wheels;Bedside commode      Prior Function Level of Independence: Independent with assistive device(s)         Comments: RW for household and limited community distances slowly.      Hand Dominance  Extremity/Trunk Assessment   Upper Extremity Assessment Upper Extremity Assessment: Generalized weakness    Lower Extremity Assessment Lower Extremity Assessment: Generalized weakness       Communication   Communication: HOH  Cognition Arousal/Alertness: Awake/alert Behavior During Therapy: WFL for tasks  assessed/performed Overall Cognitive Status: Within Functional Limits for tasks assessed                                        General Comments      Exercises     Assessment/Plan    PT Assessment Patient needs continued PT services  PT Problem List Decreased strength;Decreased activity tolerance;Decreased mobility;Cardiopulmonary status limiting activity       PT Treatment Interventions Balance training;Functional mobility training;Therapeutic activities;Therapeutic exercise;Patient/family education    PT Goals (Current goals can be found in the Care Plan section)  Acute Rehab PT Goals Patient Stated Goal: improve balance, decrease leg qweakness PT Goal Formulation: With patient Time For Goal Achievement: 07/31/17 Potential to Achieve Goals: Good    Frequency Min 2X/week   Barriers to discharge        Co-evaluation               AM-PAC PT "6 Clicks" Daily Activity  Outcome Measure Difficulty turning over in bed (including adjusting bedclothes, sheets and blankets)?: A Little Difficulty moving from lying on back to sitting on the side of the bed? : A Little Difficulty sitting down on and standing up from a chair with arms (e.g., wheelchair, bedside commode, etc,.)?: A Lot Help needed moving to and from a bed to chair (including a wheelchair)?: A Little Help needed walking in hospital room?: A Little Help needed climbing 3-5 steps with a railing? : A Little 6 Click Score: 17    End of Session Equipment Utilized During Treatment: Gait belt Activity Tolerance: Patient tolerated treatment well Patient left: in chair;with chair alarm set;with call bell/phone within reach Nurse Communication: Mobility status PT Visit Diagnosis: Unsteadiness on feet (R26.81);Difficulty in walking, not elsewhere classified (R26.2);Dizziness and giddiness (R42);Muscle weakness (generalized) (M62.81)    Time: 7001-7494 PT Time Calculation (min) (ACUTE ONLY): 14  min   Charges:   PT Evaluation $PT Eval Moderate Complexity: 1 Mod     PT G Codes:        11:27 AM, 08/06/17 Etta Grandchild, PT, DPT Physical Therapist - [facility]  [phone] ([employer])    Buccola,Allan C 08-06-2017, 11:24 AM

## 2017-07-17 NOTE — Care Management Important Message (Signed)
Important Message  Patient Details  Name: Kathryn Cain MRN: [id number] Date of Birth: 1933-10-26   Medicare Important Message Given:  N/A - LOS <3 / Initial given by admissions    Katrina Stack, RN 07/17/2017, 5:24 PM

## 2017-07-17 NOTE — Discharge Summary (Signed)
[place] [facility] [place] NAME: Kathryn Cain    MR#:  [id number]  DATE OF BIRTH:  23-Feb-1934  DATE OF ADMISSION:  07/15/2017 ADMITTING PHYSICIAN: Saundra Shelling, MD  DATE OF DISCHARGE: 07/17/17  PRIMARY CARE PHYSICIAN: Valera Castle, MD    ADMISSION DIAGNOSIS:  anemia lethargic  DISCHARGE DIAGNOSIS:  Symptomatic anemia status post 1 unit blood transfusion secondary to acute gastritis/duodenitis Chronic kidney disease stage III/IV--- she will need follow-up with nephrology as outpatient  SECONDARY DIAGNOSIS:   Past Medical History:  Diagnosis Date  . A-fib ([place])   . Anginal pain ([place])   . Arthritis   . Cancer (HCC)    BREAST  . Chronic renal insufficiency   . COPD (chronic obstructive pulmonary disease) ([place])   . Coronary artery disease   . Depression   . Diverticulosis   . Gout   . HOH (hard of hearing)   . Hyperlipidemia   . Hypertension   . Myocardial infarction ([place])   . Nephrolithiasis   . Peripheral vascular disease ([place])   . Stroke [facility])    2013    HOSPITAL COURSE:   84 year old elderly female patient with history of atrial fibrillation, chronic kidney disease, CVA, gout, hyperlipidemia, hypertension, emphysema presented to the emergency room for weakness, fatigue.  1symptomatic anemia to slow GI bleed from acute gastritis/duodenitis Hemoglobin now close to 7--- 1 unit blood transfusion --8.7 -Continue iron -Status post EGD showed acute gastritis/duodenitis.  Colonoscopy showed diverticulosis and few polyps that were removed.  Nonbleeding internal hemorrhoids. -'s were discussed with patient and patient's son in the room.  Patient will follow up with primary care physician regarding colonoscopy biopsy results. -Continue PPI daily  2.Anemia probably secondary to GI loss Appreciate gastroenterology input -Workup as above  3.Chronic kidney disease age 84/4 -Sign creatinine around  2.5-2.8 -Discussed with son at length.  Recommend follow-up with nephrology as outpatient.  Defer to primary care physician for referral to outpatient nephrology  4atrial fibrillation. Continue Norvasc and metoprolol for rate control  5DVT prophylaxis sequential compression device to lower extremities  Physical therapy to see patient.  Patient overall feels better.  Will discharge to home after seen by therapy. Patient at baseline is otherwise active.  Uses occasionally walker at home.  No acute falls.  She is continues to drive according to her son.  CONSULTS OBTAINED:  Treatment Team:  [name], MD  DRUG ALLERGIES:   Allergies  Allergen Reactions  . Amiodarone Itching  . Ciprofloxacin Hcl Other (See Comments)    Unknown reaction  . Codeine Itching  . Eliquis [Apixaban]     DISCHARGE MEDICATIONS:   Allergies as of 07/17/2017      Reactions   Amiodarone Itching   Ciprofloxacin Hcl Other (See Comments)   Unknown reaction   Codeine Itching   Eliquis [apixaban]       Medication List    STOP taking these medications   HYDROcodone-acetaminophen 5-325 MG tablet Commonly known as:  NORCO   oxyCODONE-acetaminophen 5-325 MG tablet Commonly known as:  PERCOCET   potassium chloride SA 20 MEQ tablet Commonly known as:  K-DUR,KLOR-CON     TAKE these medications   allopurinol 100 MG tablet Commonly known as:  ZYLOPRIM Take 1 tablet (100 mg total) by mouth daily. What changed:  how much to take   amLODipine 5 MG tablet Commonly known as:  NORVASC Take 5 mg by mouth daily.   aspirin 81 MG tablet Take  81 mg by mouth daily.   citalopram 20 MG tablet Commonly known as:  CELEXA Take 20 mg by mouth daily.   diphenhydramine-acetaminophen 25-500 MG Tabs tablet Commonly known as:  TYLENOL PM Take 1 tablet by mouth at bedtime as needed (for sleep).   DUREZOL 0.05 % Emul Generic drug:  Difluprednate Apply 1 drop to eye 4 (four) times daily. Begin after surgery  (07/08/2017)   FISH OIL PO Take 1 capsule by mouth daily.   furosemide 40 MG tablet Commonly known as:  LASIX Take 40 mg by mouth daily as needed for fluid.   GERITOL PO Take 1 tablet by mouth daily.   metoprolol tartrate 25 MG tablet Commonly known as:  LOPRESSOR Take 25 mg by mouth daily.   pantoprazole 40 MG tablet Commonly known as:  PROTONIX Take 1 tablet (40 mg total) by mouth daily.   pravastatin 80 MG tablet Commonly known as:  PRAVACHOL Take 80 mg by mouth at bedtime.   PROAIR HFA 108 (90 Base) MCG/ACT inhaler Generic drug:  albuterol Inhale 2 puffs into the lungs every 6 (six) hours as needed for shortness of breath or wheezing.   traMADol 50 MG tablet Commonly known as:  ULTRAM Take by mouth every 8 (eight) hours as needed.   traZODone 50 MG tablet Commonly known as:  DESYREL Take 50 mg by mouth at bedtime.       If you experience worsening of your admission symptoms, develop shortness of breath, life threatening emergency, suicidal or homicidal thoughts you must seek medical attention immediately by calling 911 or calling your MD immediately  if symptoms less severe.  You Must read complete instructions/literature along with all the possible adverse reactions/side effects for all the Medicines you take and that have been prescribed to you. Take any new Medicines after you have completely understood and accept all the possible adverse reactions/side effects.   Please note  You were cared for by a hospitalist during your hospital stay. If you have any questions about your discharge medications or the care you received while you were in the hospital after you are discharged, you can call the unit and asked to speak with the hospitalist on call if the hospitalist that took care of you is not available. Once you are discharged, your primary care physician will handle any further medical issues. Please note that NO REFILLS for any discharge medications will be  authorized once you are discharged, as it is imperative that you return to your primary care physician (or establish a relationship with a primary care physician if you do not have one) for your aftercare needs so that they can reassess your need for medications and monitor your lab values. Today   SUBJECTIVE   No new complaints.  VITAL SIGNS:  Blood pressure (!) 152/98, pulse 76, temperature 98.2 F (36.8 C), temperature source Oral, resp. rate 20, height 5\' 4"  (1.626 m), weight 63.5 kg (140 lb), SpO2 97 %.  I/O:    Intake/Output Summary (Last 24 hours) at 07/17/2017 0818 Last data filed at 07/17/2017 0420 Gross per 24 hour  Intake 906 ml  Output 1275 ml  Net -369 ml    PHYSICAL EXAMINATION:  GENERAL:  84 y.o.-year-old patient lying in the bed with no acute distress.  EYES: Pupils equal, round, reactive to light and accommodation. No scleral icterus. Extraocular muscles intact.  HEENT: Head atraumatic, normocephalic. Oropharynx and nasopharynx clear.  NECK:  Supple, no jugular venous distention. No thyroid enlargement, no  tenderness.  LUNGS: Normal breath sounds bilaterally, no wheezing, rales,rhonchi or crepitation. No use of accessory muscles of respiration.  CARDIOVASCULAR: S1, S2 normal. No murmurs, rubs, or gallops.  ABDOMEN: Soft, non-tender, non-distended. Bowel sounds present. No organomegaly or mass.  EXTREMITIES: No pedal edema, cyanosis, or clubbing.  NEUROLOGIC: Cranial nerves II through XII are intact. Muscle strength 5/5 in all extremities. Sensation intact. Gait not checked.  PSYCHIATRIC: The patient is alert and oriented x 3.  SKIN: No obvious rash, lesion, or ulcer.   DATA REVIEW:   CBC  Recent Labs  Lab 07/17/17 0500  WBC 5.5  HGB 8.7*  HCT 26.2*  PLT 135*    Chemistries  Recent Labs  Lab 07/17/17 0500  NA 138  K 4.5  CL 112*  CO2 17*  GLUCOSE 98  BUN 37*  CREATININE 3.04*  CALCIUM 8.0*    Microbiology Results   No results found for this  or any previous visit (from the past 240 hour(s)).  RADIOLOGY:  No results found.   Management plans discussed with the patient, family and they are in agreement.  CODE STATUS:     Code Status Orders  (From admission, onward)        Start     Ordered   07/15/17 1225  Full code  Continuous     07/15/17 1224    Code Status History    Date Active Date Inactive Code Status Order ID Comments User Context   This patient has a current code status but no historical code status.      TOTAL TIME TAKING CARE OF THIS PATIENT:40 minutes.    Fritzi Mandes M.D on 07/17/2017 at 8:18 AM  Between 7am to 6pm - Pager - [phone] After 6pm go to [email or web address] - password [email or web address] [facility]  Office  [phone]  CC: Primary care physician; Valera Castle, MD

## 2017-07-18 LAB — TYPE AND SCREEN
ABO/RH(D): O POS
Antibody Screen: NEGATIVE
Unit division: 0

## 2017-07-18 LAB — BPAM RBC
Blood Product Expiration Date: 201904092359
ISSUE DATE / TIME: 201903151458
Unit Type and Rh: 5100

## 2017-07-19 ENCOUNTER — Encounter: Payer: Self-pay | Admitting: Gastroenterology

## 2017-07-19 LAB — SURGICAL PATHOLOGY

## 2017-07-20 ENCOUNTER — Encounter: Payer: Self-pay | Admitting: Gastroenterology

## 2017-11-24 ENCOUNTER — Inpatient Hospital Stay
Admission: EM | Admit: 2017-11-24 | Discharge: 2017-11-28 | DRG: 682 | Disposition: A | Discharge status: Home Health | Payer: Medicare HMO | Attending: Internal Medicine | Admitting: Internal Medicine

## 2017-11-24 ENCOUNTER — Emergency Department: Payer: Medicare HMO

## 2017-11-24 ENCOUNTER — Other Ambulatory Visit: Payer: Self-pay

## 2017-11-24 ENCOUNTER — Encounter: Payer: Self-pay | Admitting: Emergency Medicine

## 2017-11-24 DIAGNOSIS — S022XXA Fracture of nasal bones, initial encounter for closed fracture: Secondary | ICD-10-CM | POA: Diagnosis present

## 2017-11-24 DIAGNOSIS — E875 Hyperkalemia: Secondary | ICD-10-CM | POA: Diagnosis present

## 2017-11-24 DIAGNOSIS — S066X0A Traumatic subarachnoid hemorrhage without loss of consciousness, initial encounter: Secondary | ICD-10-CM | POA: Diagnosis present

## 2017-11-24 DIAGNOSIS — I2510 Atherosclerotic heart disease of native coronary artery without angina pectoris: Secondary | ICD-10-CM | POA: Diagnosis present

## 2017-11-24 DIAGNOSIS — I252 Old myocardial infarction: Secondary | ICD-10-CM

## 2017-11-24 DIAGNOSIS — E039 Hypothyroidism, unspecified: Secondary | ICD-10-CM | POA: Diagnosis present

## 2017-11-24 DIAGNOSIS — I609 Nontraumatic subarachnoid hemorrhage, unspecified: Secondary | ICD-10-CM

## 2017-11-24 DIAGNOSIS — Z8673 Personal history of transient ischemic attack (TIA), and cerebral infarction without residual deficits: Secondary | ICD-10-CM

## 2017-11-24 DIAGNOSIS — Z79899 Other long term (current) drug therapy: Secondary | ICD-10-CM

## 2017-11-24 DIAGNOSIS — Z7901 Long term (current) use of anticoagulants: Secondary | ICD-10-CM

## 2017-11-24 DIAGNOSIS — I739 Peripheral vascular disease, unspecified: Secondary | ICD-10-CM | POA: Diagnosis present

## 2017-11-24 DIAGNOSIS — H9190 Unspecified hearing loss, unspecified ear: Secondary | ICD-10-CM | POA: Diagnosis present

## 2017-11-24 DIAGNOSIS — N184 Chronic kidney disease, stage 4 (severe): Secondary | ICD-10-CM | POA: Diagnosis present

## 2017-11-24 DIAGNOSIS — Z853 Personal history of malignant neoplasm of breast: Secondary | ICD-10-CM

## 2017-11-24 DIAGNOSIS — Y92008 Other place in unspecified non-institutional (private) residence as the place of occurrence of the external cause: Secondary | ICD-10-CM

## 2017-11-24 DIAGNOSIS — F17210 Nicotine dependence, cigarettes, uncomplicated: Secondary | ICD-10-CM | POA: Diagnosis present

## 2017-11-24 DIAGNOSIS — W1830XA Fall on same level, unspecified, initial encounter: Secondary | ICD-10-CM | POA: Diagnosis present

## 2017-11-24 DIAGNOSIS — F329 Major depressive disorder, single episode, unspecified: Secondary | ICD-10-CM | POA: Diagnosis present

## 2017-11-24 DIAGNOSIS — Z7982 Long term (current) use of aspirin: Secondary | ICD-10-CM

## 2017-11-24 DIAGNOSIS — E785 Hyperlipidemia, unspecified: Secondary | ICD-10-CM | POA: Diagnosis present

## 2017-11-24 DIAGNOSIS — D62 Acute posthemorrhagic anemia: Secondary | ICD-10-CM | POA: Diagnosis present

## 2017-11-24 DIAGNOSIS — Z9011 Acquired absence of right breast and nipple: Secondary | ICD-10-CM

## 2017-11-24 DIAGNOSIS — E872 Acidosis: Secondary | ICD-10-CM | POA: Diagnosis present

## 2017-11-24 DIAGNOSIS — N179 Acute kidney failure, unspecified: Secondary | ICD-10-CM | POA: Diagnosis not present

## 2017-11-24 DIAGNOSIS — I480 Paroxysmal atrial fibrillation: Secondary | ICD-10-CM | POA: Diagnosis present

## 2017-11-24 DIAGNOSIS — J449 Chronic obstructive pulmonary disease, unspecified: Secondary | ICD-10-CM | POA: Diagnosis present

## 2017-11-24 DIAGNOSIS — I129 Hypertensive chronic kidney disease with stage 1 through stage 4 chronic kidney disease, or unspecified chronic kidney disease: Secondary | ICD-10-CM | POA: Diagnosis present

## 2017-11-24 DIAGNOSIS — Z951 Presence of aortocoronary bypass graft: Secondary | ICD-10-CM

## 2017-11-24 DIAGNOSIS — N189 Chronic kidney disease, unspecified: Secondary | ICD-10-CM

## 2017-11-24 DIAGNOSIS — Z79890 Hormone replacement therapy (postmenopausal): Secondary | ICD-10-CM

## 2017-11-24 LAB — CBC
HEMATOCRIT: 22.5 % — AB (ref 35.0–47.0)
HEMOGLOBIN: 7.6 g/dL — AB (ref 12.0–16.0)
MCH: 32.3 pg (ref 26.0–34.0)
MCHC: 33.9 g/dL (ref 32.0–36.0)
MCV: 95.5 fL (ref 80.0–100.0)
Platelets: 156 10*3/uL (ref 150–440)
RBC: 2.36 MIL/uL — ABNORMAL LOW (ref 3.80–5.20)
RDW: 13.2 % (ref 11.5–14.5)
WBC: 6.5 10*3/uL (ref 3.6–11.0)

## 2017-11-24 LAB — BASIC METABOLIC PANEL
ANION GAP: 10 (ref 5–15)
BUN: 64 mg/dL — ABNORMAL HIGH (ref 8–23)
CHLORIDE: 109 mmol/L (ref 98–111)
CO2: 18 mmol/L — ABNORMAL LOW (ref 22–32)
Calcium: 8.8 mg/dL — ABNORMAL LOW (ref 8.9–10.3)
Creatinine, Ser: 5.82 mg/dL — ABNORMAL HIGH (ref 0.44–1.00)
GFR calc Af Amer: 7 mL/min — ABNORMAL LOW (ref >60)
GFR, EST NON AFRICAN AMERICAN: 6 mL/min — AB (ref >60)
GLUCOSE: 130 mg/dL — AB (ref 70–99)
POTASSIUM: 5.3 mmol/L — AB (ref 3.5–5.1)
Sodium: 137 mmol/L (ref 135–145)

## 2017-11-24 LAB — TROPONIN I: Troponin I: 0.04 ng/mL (ref <0.03)

## 2017-11-24 MED ORDER — ACETAMINOPHEN 500 MG PO TABS
1000.0000 mg | ORAL_TABLET | Freq: Once | ORAL | Status: AC
  Administered 2017-11-24: 1000 mg via ORAL
  Filled 2017-11-24: qty 2

## 2017-11-24 MED ORDER — LABETALOL HCL 5 MG/ML IV SOLN
10.0000 mg | Freq: Once | INTRAVENOUS | Status: AC
  Administered 2017-11-24: 10 mg via INTRAVENOUS
  Filled 2017-11-24: qty 4

## 2017-11-24 MED ORDER — LEVETIRACETAM IN NACL 500 MG/100ML IV SOLN
500.0000 mg | Freq: Once | INTRAVENOUS | Status: AC
  Administered 2017-11-25: 500 mg via INTRAVENOUS
  Filled 2017-11-24: qty 100

## 2017-11-24 MED ORDER — MORPHINE SULFATE (PF) 4 MG/ML IV SOLN
4.0000 mg | Freq: Once | INTRAVENOUS | Status: AC
  Administered 2017-11-24: 4 mg via INTRAVENOUS
  Filled 2017-11-24: qty 1

## 2017-11-24 NOTE — ED Provider Notes (Signed)
[facility] Emergency Department Provider Note ____________________________________________   First MD Initiated Contact with Patient 11/24/17 2055     (approximate)  I have reviewed the triage vital signs and the nursing notes.   HISTORY  Chief Complaint Fall    HPI Kathryn Cain is a 84 y.o. female with PMH as noted below including atrial fibrillation (only on aspirin as far as the patient and family know) who presents with head injury after a fall.  Per the family member, the patient was at her baseline mental status this evening.  She was in another room and was heard to fall.  The family member entered the room immediately afterwards and the patient was alert.  There is no evidence that the patient lost consciousness.  She denies any dizziness or lightheadedness.  The family states that the patient is at her baseline mental status now.  She reports headache, nasal area pain, and pain to her left elbow and right shoulder.  She denies any neck pain.  Past Medical History:  Diagnosis Date  . A-fib ([place])   . Anginal pain ([place])   . Arthritis   . Cancer (HCC)    BREAST  . Chronic renal insufficiency   . COPD (chronic obstructive pulmonary disease) ([place])   . Coronary artery disease   . Depression   . Diverticulosis   . Gout   . HOH (hard of hearing)   . Hyperlipidemia   . Hypertension   . Myocardial infarction ([place])   . Nephrolithiasis   . Peripheral vascular disease ([place])   . Stroke [facility])    2013    Patient Active Problem List   Diagnosis Date Noted  . Benign neoplasm of descending colon   . Gastritis without bleeding   . Duodenitis   . GI bleed 07/15/2017  . Iron deficiency anemia     Past Surgical History:  Procedure Laterality Date  . AMPUTATION Right 04/02/2016   Procedure: AMPUTATION FINGER TIP;  Surgeon: Hessie Knows, MD;  Location: [facility] ORS;  Service: Orthopedics;  Laterality: Right;  . APPENDECTOMY    . BREAST SURGERY Right    . CARDIAC SURGERY    . CATARACT EXTRACTION W/PHACO Right 07/08/2017   Procedure: CATARACT EXTRACTION PHACO AND INTRAOCULAR LENS PLACEMENT (IOC);  Surgeon: Eulogio Bear, MD;  Location: [facility] ORS;  Service: Ophthalmology;  Laterality: Right;  fluid lot #[id number]  exp 02/01/2019 [place]   00:32.2 AP%   12.7 CDE  4.08   . COLONOSCOPY WITH PROPOFOL N/A 07/16/2017   Procedure: COLONOSCOPY WITH PROPOFOL;  Surgeon: [name], MD;  Location: [facility] ENDOSCOPY;  Service: Endoscopy;  Laterality: N/A;  . CORONARY ARTERY BYPASS GRAFT     2013  . DISTAL INTERPHALANGEAL JOINT FUSION Right 11/28/2015   Procedure: DISTAL INTERPHALANGEAL JOINT FUSION;  Surgeon: Hessie Knows, MD;  Location: [facility] ORS;  Service: Orthopedics;  Laterality: Right;  third finger  . ESOPHAGOGASTRODUODENOSCOPY (EGD) WITH PROPOFOL N/A 07/16/2017   Procedure: ESOPHAGOGASTRODUODENOSCOPY (EGD) WITH PROPOFOL;  Surgeon: [name], MD;  Location: [facility] ENDOSCOPY;  Service: Endoscopy;  Laterality: N/A;  . KIDNEY STONE SURGERY    . MASTECTOMY Right    1986    Prior to Admission medications   Medication Sig Start Date End Date Taking? Authorizing Provider  allopurinol (ZYLOPRIM) 100 MG tablet Take 1 tablet (100 mg total) by mouth daily. 07/17/17   Fritzi Mandes, MD  amLODipine (NORVASC) 5 MG tablet Take 5 mg by mouth daily.     [provider]  aspirin 81 MG tablet Take 81 mg by mouth daily.    [provider]  citalopram (CELEXA) 20 MG tablet Take 20 mg by mouth daily.    [provider]  diphenhydramine-acetaminophen (TYLENOL PM) 25-500 MG TABS tablet Take 1 tablet by mouth at bedtime as needed (for sleep).     [provider]  DUREZOL 0.05 % EMUL Apply 1 drop to eye 4 (four) times daily. Begin after surgery (07/08/2017)    Eulogio Bear, MD  ELIQUIS 5 MG TABS tablet Take 5 mg by mouth 2 (two) times daily. 11/12/17   [provider]  furosemide (LASIX) 40 MG tablet Take 40 mg by mouth daily as  needed for fluid.     [provider]  hydrOXYzine (ATARAX/VISTARIL) 25 MG tablet TAKE 0.5 TABLETS (12.5 MG TOTAL) BY MOUTH 3 (THREE) TIMES DAILY AS NEEDED FOR ITCHING 10/19/17   [provider]  Iron-Vitamins (GERITOL PO) Take 1 tablet by mouth daily.     [provider]  levothyroxine (SYNTHROID, LEVOTHROID) 25 MCG tablet PLEASE SEE ATTACHED FOR DETAILED DIRECTIONS 10/05/17   [provider]  metoprolol tartrate (LOPRESSOR) 25 MG tablet Take 25 mg by mouth daily.     [provider]  Omega-3 Fatty Acids (FISH OIL PO) Take 1 capsule by mouth daily.    [provider]  pantoprazole (PROTONIX) 40 MG tablet Take 1 tablet (40 mg total) by mouth daily. 07/17/17   Fritzi Mandes, MD  pravastatin (PRAVACHOL) 80 MG tablet Take 80 mg by mouth at bedtime.     [provider]  PROAIR HFA 108 [phone] Base) MCG/ACT inhaler Inhale 2 puffs into the lungs every 6 (six) hours as needed for shortness of breath or wheezing. 05/28/17   [provider]  traMADol (ULTRAM) 50 MG tablet Take by mouth every 8 (eight) hours as needed.    [provider]  traZODone (DESYREL) 50 MG tablet Take 50 mg by mouth at bedtime.    [provider]    Allergies Amiodarone; Ciprofloxacin hcl; Codeine; and Eliquis [apixaban]  History reviewed. No pertinent family history.  Social History Social History   Tobacco Use  . Smoking status: Current Some Day Smoker    Packs/day: 0.50    Types: Cigarettes  . Smokeless tobacco: Never Used  Substance Use Topics  . Alcohol use: No  . Drug use: No    Review of Systems  Constitutional: No fever. Eyes: No visual changes. ENT: No neck pain. Cardiovascular: Denies chest pain. Respiratory: Denies shortness of breath. Gastrointestinal: No vomiting. Genitourinary: Negative for flank pain. Musculoskeletal: Negative for back pain.  Positive for left elbow and right shoulder pain. Skin: Negative for  rash. Neurological: Positive for headache.   ____________________________________________   PHYSICAL EXAM:  VITAL SIGNS: ED Triage Vitals  Enc Vitals Group     BP 11/24/17 2022 (!) 205/106     Pulse Rate 11/24/17 2022 94     Resp 11/24/17 2022 11     Temp --      Temp src --      SpO2 11/24/17 2022 100 %     Weight 11/24/17 2024 130 lb (59 kg)     Height 11/24/17 2024 5\' 4"  (1.626 m)     Head Circumference --      Peak Flow --      Pain Score 11/24/17 2023 6     Pain Loc --      Pain Edu? --  Excl. in [place]? --     Constitutional: Alert and oriented.  Relatively comfortable appearing and in no acute distress. Eyes: Conjunctivae are normal.  EOMI.  PERRLA. Head: Tenderness and swelling to the nasal bridge.  1 cm superficial laceration to nasal bridge.  Two 1 cm superficial lacerations to the mid forehead. Nose: No congestion/rhinnorhea. Mouth/Throat: Mucous membranes are moist.   Neck: Normal range of motion.  No midline cervical spinal tenderness. Cardiovascular: Borderline tachycardia, regular rhythm. Grossly normal heart sounds.  Good peripheral circulation. Respiratory: Normal respiratory effort.  No retractions. Lungs CTAB. Gastrointestinal: Soft and nontender. No distention.  Genitourinary: No flank tenderness. Musculoskeletal: No lower extremity edema.  Extremities warm and well perfused.  Bruising and mild tenderness to left elbow.  Right shoulder with no deformity.  No midline spinal tenderness. Neurologic:  Normal speech and language.  Motor and sensory intact in all extremities.  Normal coordination.  No gross focal neurologic deficits are appreciated.  Skin:  Skin is warm and dry. No rash noted. Psychiatric: Mood and affect are normal. Speech and behavior are normal.  ____________________________________________   LABS (all labs ordered are listed, but only abnormal results are displayed)  Labs Reviewed  BASIC METABOLIC PANEL - Abnormal; Notable for the  following components:      Result Value   Potassium 5.3 (*)    CO2 18 (*)    Glucose, Bld 130 (*)    BUN 64 (*)    Creatinine, Ser 5.82 (*)    Calcium 8.8 (*)    GFR calc non Af Amer 6 (*)    GFR calc Af Amer 7 (*)    All other components within normal limits  CBC - Abnormal; Notable for the following components:   RBC 2.36 (*)    Hemoglobin 7.6 (*)    HCT 22.5 (*)    All other components within normal limits  TROPONIN I - Abnormal; Notable for the following components:   Troponin I 0.04 (*)    All other components within normal limits  URINALYSIS, COMPLETE (UACMP) WITH MICROSCOPIC   ____________________________________________  EKG  ED ECG REPORT I, Arta Silence, the attending physician, personally viewed and interpreted this ECG.  Date: 11/24/2017 EKG Time: 2022 Rate: 90 Rhythm: normal sinus rhythm QRS Axis: normal Intervals: RBBB ST/T Wave abnormalities: normal Narrative Interpretation: no evidence of acute ischemia  ____________________________________________  RADIOLOGY  CT head: Subarachnoid hemorrhage on the right tentorium with no midline shift or mass-effect CT cervical spine: Degenerative changes but no acute fracture CT maxillofacial: Nasal bone fractures XR left elbow: No acute fracture XR right shoulder: No acute fracture  ____________________________________________   PROCEDURES  Procedure(s) performed: Yes  .Marland KitchenLaceration Repair Date/Time: 11/24/2017 11:51 PM Performed by: Arta Silence, MD Authorized by: Arta Silence, MD   Consent:    Consent obtained:  Verbal   Consent given by:  Patient   Alternatives discussed:  No treatment Anesthesia (see MAR for exact dosages):    Anesthesia method:  None Laceration details:    Length (cm):  1   Depth (mm):  2 Repair type:    Repair type:  Simple Exploration:    Wound exploration: entire depth of wound probed and visualized     Contaminated: no   Treatment:    Area  cleansed with:  Betadine   Amount of cleaning:  Standard Skin repair:    Repair method:  Tissue adhesive Post-procedure details:    Dressing:  Open (no dressing)   Patient tolerance of procedure:  Tolerated well, no immediate complications    Critical Care performed: Yes  CRITICAL CARE Performed by: Arta Silence   Total critical care time: 40 minutes  Critical care time was exclusive of separately billable procedures and treating other patients.  Critical care was necessary to treat or prevent imminent or life-threatening deterioration.  Critical care was time spent personally by me on the following activities: development of treatment plan with patient and/or surrogate as well as nursing, discussions with consultants, evaluation of patient's response to treatment, examination of patient, obtaining history from patient or surrogate, ordering and performing treatments and interventions, ordering and review of laboratory studies, ordering and review of radiographic studies, pulse oximetry and re-evaluation of patient's condition. ____________________________________________   INITIAL IMPRESSION / ASSESSMENT AND PLAN / ED COURSE  Pertinent labs & imaging results that were available during my care of the patient were reviewed by me and considered in my medical decision making (see chart for details).  84 year old female with PMH as noted above presents with head injury after an apparent fall from standing height.  There is no evidence of near syncope or prodrome, and she had no LOC.  Per the family, she is currently at her baseline mental status.  On exam, she is hypertensive but her other vital signs are normal.  She is alert and keenly responsive (although hard of hearing at baseline).  Neuro exam is nonfocal.  The remainder of the exam is as described above with some tenderness and bruising to the left elbow as well as swelling and several superficial lacerations to the nasal  bridge and forehead.  The patient has no significant midline tenderness, but given the slight difficulty in listening a reliable exam due to her age and difficulty hearing, I will obtain a CT of this as well.  Clinical Course as of Nov 25 2354  Wed Nov 24, 2017  2203 BUN(!): 64 [SS]    Clinical Course User Index [SS] Arta Silence, MD   ----------------------------------------- 11:47 PM on 11/24/2017 -----------------------------------------  The patient's lab work-up reveals borderline elevated troponin although she has had elevated troponins previously.  Her creatinine is also slightly elevated compared to her baseline.  The patient is chronically anemic.  Her hemoglobin today is 7.6 although this is not significantly changed from prior labs that we have.  Her blood pressure is significantly improved after IV labetalol.  The imaging reveals a subarachnoid hemorrhage in the right tentorial area with no mass-effect or midline shift.  The patient's neuro status remains stable.  She also has nasal bone fracture.  The CT of the cervical spine shows some C4/C5 subluxation although this is most likely degenerative.  The patient clinically has no tenderness in the area, no weakness or numbness, and no evidence of acute ligamentous injury.  I contacted the [facility] transfer center to discuss with neurosurgery and arrange for possible transfer if indicated.  I spoke to Dr. Nikki Dom from [facility] neurosurgery.  At this time, there are no beds available so the patient was placed on the wait list.  However, Dr. Nikki Dom advised that given the patient's stable neurologic status she could potentially get a repeat CT after 6 hours and then be discharged if stable.  I discussed this plan with the patient and her family and they are in agreement.  The plan will be repeat CT at approximately 3 AM.  Dr. Nikki Dom advised that he would be on at [facility] neurosurgery all night and available for consultation about the results.  The patient will remain on the wait list in the meantime.  The patient also has apparent acute kidney injury, so if she does not have an indication to go to [facility] neurosurgery, she will likely be admitted to the medicine service here.  I am signing the patient out to the oncoming physician Dr. Owens Shark.   ____________________________________________   FINAL CLINICAL IMPRESSION(S) / ED DIAGNOSES  Final diagnoses:  Subarachnoid hemorrhage ([place])  Acute kidney injury ([place])  Closed fracture of nasal bone, initial encounter      NEW MEDICATIONS STARTED DURING THIS VISIT:  New Prescriptions   No medications on file     Note:  This document was prepared using Dragon voice recognition software and may include unintentional dictation errors.    Arta Silence, MD 11/24/17 2356

## 2017-11-24 NOTE — ED Triage Notes (Signed)
Pt to ED via EMS from home c/o unwitnessed fall walking to the bathroom and son heard patient fall and states patient was conscious.  Pt unsure of why she fell, states pain to forehead and has hematoma, laceration to nose.  Pt A&Ox4, speaking in complete and coherent sentences upon arrival.  EMS states on blood thinners.  EMS CBG 145

## 2017-11-24 NOTE — ED Notes (Signed)
Xeroform and bandaid to nose

## 2017-11-25 ENCOUNTER — Emergency Department: Payer: Medicare HMO

## 2017-11-25 DIAGNOSIS — N179 Acute kidney failure, unspecified: Secondary | ICD-10-CM | POA: Diagnosis present

## 2017-11-25 DIAGNOSIS — I480 Paroxysmal atrial fibrillation: Secondary | ICD-10-CM | POA: Diagnosis present

## 2017-11-25 DIAGNOSIS — F17210 Nicotine dependence, cigarettes, uncomplicated: Secondary | ICD-10-CM | POA: Diagnosis present

## 2017-11-25 DIAGNOSIS — J449 Chronic obstructive pulmonary disease, unspecified: Secondary | ICD-10-CM | POA: Diagnosis present

## 2017-11-25 DIAGNOSIS — Z79899 Other long term (current) drug therapy: Secondary | ICD-10-CM | POA: Diagnosis not present

## 2017-11-25 DIAGNOSIS — F329 Major depressive disorder, single episode, unspecified: Secondary | ICD-10-CM | POA: Diagnosis present

## 2017-11-25 DIAGNOSIS — Z853 Personal history of malignant neoplasm of breast: Secondary | ICD-10-CM | POA: Diagnosis not present

## 2017-11-25 DIAGNOSIS — N189 Chronic kidney disease, unspecified: Secondary | ICD-10-CM

## 2017-11-25 DIAGNOSIS — Z8673 Personal history of transient ischemic attack (TIA), and cerebral infarction without residual deficits: Secondary | ICD-10-CM | POA: Diagnosis not present

## 2017-11-25 DIAGNOSIS — Z7901 Long term (current) use of anticoagulants: Secondary | ICD-10-CM | POA: Diagnosis not present

## 2017-11-25 DIAGNOSIS — I252 Old myocardial infarction: Secondary | ICD-10-CM | POA: Diagnosis not present

## 2017-11-25 DIAGNOSIS — Z9011 Acquired absence of right breast and nipple: Secondary | ICD-10-CM | POA: Diagnosis not present

## 2017-11-25 DIAGNOSIS — Y92008 Other place in unspecified non-institutional (private) residence as the place of occurrence of the external cause: Secondary | ICD-10-CM | POA: Diagnosis not present

## 2017-11-25 DIAGNOSIS — Z951 Presence of aortocoronary bypass graft: Secondary | ICD-10-CM | POA: Diagnosis not present

## 2017-11-25 DIAGNOSIS — S022XXA Fracture of nasal bones, initial encounter for closed fracture: Secondary | ICD-10-CM | POA: Diagnosis present

## 2017-11-25 DIAGNOSIS — I739 Peripheral vascular disease, unspecified: Secondary | ICD-10-CM | POA: Diagnosis present

## 2017-11-25 DIAGNOSIS — N184 Chronic kidney disease, stage 4 (severe): Secondary | ICD-10-CM | POA: Diagnosis present

## 2017-11-25 DIAGNOSIS — D62 Acute posthemorrhagic anemia: Secondary | ICD-10-CM | POA: Diagnosis present

## 2017-11-25 DIAGNOSIS — H9190 Unspecified hearing loss, unspecified ear: Secondary | ICD-10-CM | POA: Diagnosis present

## 2017-11-25 DIAGNOSIS — I2510 Atherosclerotic heart disease of native coronary artery without angina pectoris: Secondary | ICD-10-CM | POA: Diagnosis present

## 2017-11-25 DIAGNOSIS — S066X0A Traumatic subarachnoid hemorrhage without loss of consciousness, initial encounter: Secondary | ICD-10-CM | POA: Diagnosis present

## 2017-11-25 DIAGNOSIS — Z7982 Long term (current) use of aspirin: Secondary | ICD-10-CM | POA: Diagnosis not present

## 2017-11-25 DIAGNOSIS — E039 Hypothyroidism, unspecified: Secondary | ICD-10-CM | POA: Diagnosis present

## 2017-11-25 DIAGNOSIS — Z79890 Hormone replacement therapy (postmenopausal): Secondary | ICD-10-CM | POA: Diagnosis not present

## 2017-11-25 DIAGNOSIS — E785 Hyperlipidemia, unspecified: Secondary | ICD-10-CM | POA: Diagnosis present

## 2017-11-25 DIAGNOSIS — W1830XA Fall on same level, unspecified, initial encounter: Secondary | ICD-10-CM | POA: Diagnosis present

## 2017-11-25 DIAGNOSIS — E872 Acidosis: Secondary | ICD-10-CM | POA: Diagnosis present

## 2017-11-25 LAB — TSH: TSH: 4.558 u[IU]/mL — ABNORMAL HIGH (ref 0.350–4.500)

## 2017-11-25 LAB — HEMOGLOBIN A1C
Hgb A1c MFr Bld: 5 % (ref 4.8–5.6)
Mean Plasma Glucose: 96.8 mg/dL

## 2017-11-25 MED ORDER — ACETAMINOPHEN 500 MG PO TABS: 500.0000 mg | ORAL_TABLET | Freq: Every evening | ORAL | Status: DC | PRN

## 2017-11-25 MED ORDER — LEVOTHYROXINE SODIUM 25 MCG PO TABS
25.0000 ug | ORAL_TABLET | Freq: Every day | ORAL | Status: DC
  Administered 2017-11-25 – 2017-11-28 (×4): 25 ug via ORAL
  Filled 2017-11-25 (×4): qty 1

## 2017-11-25 MED ORDER — ASPIRIN 81 MG PO CHEW: 81.0000 mg | CHEWABLE_TABLET | Freq: Every day | ORAL | Status: DC

## 2017-11-25 MED ORDER — PANTOPRAZOLE SODIUM 40 MG PO TBEC
40.0000 mg | DELAYED_RELEASE_TABLET | Freq: Every day | ORAL | Status: DC
  Administered 2017-11-25 – 2017-11-28 (×4): 40 mg via ORAL
  Filled 2017-11-25 (×4): qty 1

## 2017-11-25 MED ORDER — CITALOPRAM HYDROBROMIDE 20 MG PO TABS
20.0000 mg | ORAL_TABLET | Freq: Every day | ORAL | Status: DC
  Administered 2017-11-25 – 2017-11-28 (×4): 20 mg via ORAL
  Filled 2017-11-25 (×4): qty 1

## 2017-11-25 MED ORDER — ONDANSETRON HCL 4 MG/2ML IJ SOLN
4.0000 mg | Freq: Once | INTRAMUSCULAR | Status: AC
  Administered 2017-11-25: 4 mg via INTRAVENOUS

## 2017-11-25 MED ORDER — ONDANSETRON HCL 4 MG PO TABS
4.0000 mg | ORAL_TABLET | Freq: Four times a day (QID) | ORAL | Status: DC | PRN
  Filled 2017-11-25: qty 1

## 2017-11-25 MED ORDER — DOCUSATE SODIUM 100 MG PO CAPS
100.0000 mg | ORAL_CAPSULE | Freq: Two times a day (BID) | ORAL | Status: DC
  Administered 2017-11-25 – 2017-11-28 (×7): 100 mg via ORAL
  Filled 2017-11-25 (×7): qty 1

## 2017-11-25 MED ORDER — DIPHENHYDRAMINE HCL 25 MG PO CAPS
25.0000 mg | ORAL_CAPSULE | Freq: Every evening | ORAL | Status: DC | PRN
Start: 2017-11-25 — End: 2017-11-28
  Administered 2017-11-25 – 2017-11-27 (×3): 25 mg via ORAL
  Filled 2017-11-25 (×3): qty 1

## 2017-11-25 MED ORDER — HYDROXYZINE HCL 25 MG PO TABS
25.0000 mg | ORAL_TABLET | Freq: Three times a day (TID) | ORAL | Status: DC | PRN
  Filled 2017-11-25 (×2): qty 1

## 2017-11-25 MED ORDER — DIPHENHYDRAMINE-APAP (SLEEP) 25-500 MG PO TABS: 1.0000 | ORAL_TABLET | Freq: Every evening | ORAL | Status: DC | PRN

## 2017-11-25 MED ORDER — ONDANSETRON HCL 4 MG/2ML IJ SOLN
4.0000 mg | Freq: Four times a day (QID) | INTRAMUSCULAR | Status: DC | PRN
  Administered 2017-11-27: 4 mg via INTRAVENOUS
  Filled 2017-11-25: qty 2

## 2017-11-25 MED ORDER — FUROSEMIDE 40 MG PO TABS: 40.0000 mg | ORAL_TABLET | Freq: Every day | ORAL | Status: DC | PRN

## 2017-11-25 MED ORDER — AMLODIPINE BESYLATE 5 MG PO TABS
5.0000 mg | ORAL_TABLET | Freq: Every day | ORAL | Status: DC
  Administered 2017-11-25 – 2017-11-26 (×2): 5 mg via ORAL
  Filled 2017-11-25 (×2): qty 1

## 2017-11-25 MED ORDER — ONDANSETRON HCL 4 MG/2ML IJ SOLN
INTRAMUSCULAR | Status: AC
  Administered 2017-11-25: 4 mg
  Filled 2017-11-25: qty 2

## 2017-11-25 MED ORDER — TRAZODONE HCL 50 MG PO TABS
50.0000 mg | ORAL_TABLET | Freq: Every day | ORAL | Status: DC
  Administered 2017-11-25: 21:00:00 50 mg via ORAL
  Filled 2017-11-25: qty 1

## 2017-11-25 MED ORDER — PRAVASTATIN SODIUM 20 MG PO TABS
80.0000 mg | ORAL_TABLET | Freq: Every day | ORAL | Status: DC
  Administered 2017-11-25 – 2017-11-27 (×3): 80 mg via ORAL
  Filled 2017-11-25 (×3): qty 4

## 2017-11-25 MED ORDER — ACETAMINOPHEN 325 MG PO TABS
650.0000 mg | ORAL_TABLET | Freq: Four times a day (QID) | ORAL | Status: DC | PRN
  Administered 2017-11-25 – 2017-11-28 (×8): 650 mg via ORAL
  Filled 2017-11-25 (×8): qty 2

## 2017-11-25 MED ORDER — SODIUM CHLORIDE 0.9 % IV SOLN
INTRAVENOUS | Status: DC
  Administered 2017-11-25 – 2017-11-27 (×7): via INTRAVENOUS

## 2017-11-25 MED ORDER — DIFLUPREDNATE 0.05 % OP EMUL: 1.0000 [drp] | Freq: Four times a day (QID) | OPHTHALMIC | Status: DC

## 2017-11-25 MED ORDER — METOPROLOL TARTRATE 25 MG PO TABS
25.0000 mg | ORAL_TABLET | Freq: Every day | ORAL | Status: DC
  Administered 2017-11-25 – 2017-11-27 (×3): 25 mg via ORAL
  Filled 2017-11-25 (×3): qty 1

## 2017-11-25 MED ORDER — ACETAMINOPHEN 650 MG RE SUPP: 650.0000 mg | Freq: Four times a day (QID) | RECTAL | Status: DC | PRN

## 2017-11-25 NOTE — Consult Note (Signed)
Referring Physician:  No referring provider defined for this encounter.  Primary Physician:  Valera Castle, MD  Chief Complaint: Subarachnoid hemorrhage  History of Present Illness: Kathryn Cain is a 84 y.o. female who presents as a consult to neurosurgery for evaluation of subarachnoid hemorrhage sustained after a fall 1 day ago. Upon evaluation in the emergency department CT scan revealed 1 x 1.6 subarachnoid hemorrhage.  Repeat CT ultimately 6 hours after revealed stabilization of hemorrhage.  Patient on Eliquis and daily aspirin 81 mg.  Per son it is the believed last dosage of these medications were 1 day ago. On evaluation patient denies any complaints such as headache/vision changes/neurological deficits including extremity weakness/numbness/tingling/pain.  Son denies any changes in behavior. Baseline hearing impaired.  Review of Systems:  A 10 point review of systems is negative, except for the pertinent positives and negatives detailed in the HPI.  Past Medical History: Past Medical History:  Diagnosis Date  . A-fib ([place])   . Anginal pain ([place])   . Arthritis   . Cancer ([facility])    BREAST  . Chronic renal insufficiency   . COPD (chronic obstructive pulmonary disease) ([place])   . Coronary artery disease   . Depression   . Diverticulosis   . Gout   . HOH (hard of hearing)   . Hyperlipidemia   . Hypertension   . Myocardial infarction ([place])   . Nephrolithiasis   . Peripheral vascular disease ([place])   . Stroke [facility])    2013    Past Surgical History: Past Surgical History:  Procedure Laterality Date  . AMPUTATION Right 04/02/2016   Procedure: AMPUTATION FINGER TIP;  Surgeon: Hessie Knows, MD;  Location: [facility] ORS;  Service: Orthopedics;  Laterality: Right;  . APPENDECTOMY    . BREAST SURGERY Right   . CARDIAC SURGERY    . CATARACT EXTRACTION W/PHACO Right 07/08/2017   Procedure: CATARACT EXTRACTION PHACO AND INTRAOCULAR LENS PLACEMENT (IOC);  Surgeon: Eulogio Bear, MD;  Location: [facility] ORS;  Service: Ophthalmology;  Laterality: Right;  fluid lot #[id number]  exp 02/01/2019 [place]   00:32.2 AP%   12.7 CDE  4.08   . COLONOSCOPY WITH PROPOFOL N/A 07/16/2017   Procedure: COLONOSCOPY WITH PROPOFOL;  Surgeon: [name], MD;  Location: [facility] ENDOSCOPY;  Service: Endoscopy;  Laterality: N/A;  . CORONARY ARTERY BYPASS GRAFT     2013  . DISTAL INTERPHALANGEAL JOINT FUSION Right 11/28/2015   Procedure: DISTAL INTERPHALANGEAL JOINT FUSION;  Surgeon: Hessie Knows, MD;  Location: [facility] ORS;  Service: Orthopedics;  Laterality: Right;  third finger  . ESOPHAGOGASTRODUODENOSCOPY (EGD) WITH PROPOFOL N/A 07/16/2017   Procedure: ESOPHAGOGASTRODUODENOSCOPY (EGD) WITH PROPOFOL;  Surgeon: [name], MD;  Location: [facility] ENDOSCOPY;  Service: Endoscopy;  Laterality: N/A;  . KIDNEY STONE SURGERY    . MASTECTOMY Right    1986    Allergies: Allergies as of 11/24/2017 - Review Complete 11/24/2017  Allergen Reaction Noted  . Amiodarone Itching 06/30/2017  . Ciprofloxacin hcl Other (See Comments) 11/21/2015  . Codeine Itching 09/04/2015  . Eliquis [apixaban]  07/05/2017    Medications:  Current Facility-Administered Medications:  .  0.9 %  sodium chloride infusion, , Intravenous, Continuous, Harrie Foreman, MD, Stopped at 11/25/17 [phone] .  acetaminophen (TYLENOL) tablet 650 mg, 650 mg, Oral, Q6H PRN, 650 mg at 11/25/17 0858 **OR** acetaminophen (TYLENOL) suppository 650 mg, 650 mg, Rectal, Q6H PRN, Harrie Foreman, MD .  diphenhydrAMINE (BENADRYL) capsule 25 mg, 25 mg, Oral, QHS PRN **OR** acetaminophen (  TYLENOL) tablet 500 mg, 500 mg, Oral, QHS PRN, Harrie Foreman, MD .  amLODipine (NORVASC) tablet 5 mg, 5 mg, Oral, Daily, Harrie Foreman, MD, 5 mg at 11/25/17 0858 .  citalopram (CELEXA) tablet 20 mg, 20 mg, Oral, Daily, Harrie Foreman, MD, 20 mg at 11/25/17 0858 .  Difluprednate 0.05 % EMUL 1 drop, 1 drop, Ophthalmic, QID, Harrie Foreman, MD .   docusate sodium (COLACE) capsule 100 mg, 100 mg, Oral, BID, Harrie Foreman, MD, 100 mg at 11/25/17 0858 .  furosemide (LASIX) tablet 40 mg, 40 mg, Oral, Daily PRN, Harrie Foreman, MD .  hydrOXYzine (ATARAX/VISTARIL) tablet 25 mg, 25 mg, Oral, TID PRN, Harrie Foreman, MD .  levothyroxine (SYNTHROID, LEVOTHROID) tablet 25 mcg, 25 mcg, Oral, QAC breakfast, Harrie Foreman, MD, 25 mcg at 11/25/17 [phone] .  metoprolol tartrate (LOPRESSOR) tablet 25 mg, 25 mg, Oral, Daily, Harrie Foreman, MD, 25 mg at 11/25/17 0858 .  ondansetron (ZOFRAN) tablet 4 mg, 4 mg, Oral, Q6H PRN **OR** ondansetron (ZOFRAN) injection 4 mg, 4 mg, Intravenous, Q6H PRN, Harrie Foreman, MD .  pantoprazole (PROTONIX) EC tablet 40 mg, 40 mg, Oral, Daily, Harrie Foreman, MD, 40 mg at 11/25/17 0858 .  pravastatin (PRAVACHOL) tablet 80 mg, 80 mg, Oral, QHS, Harrie Foreman, MD .  traZODone (DESYREL) tablet 50 mg, 50 mg, Oral, QHS, Harrie Foreman, MD   Social History: Social History   Tobacco Use  . Smoking status: Current Some Day Smoker    Packs/day: 0.50    Types: Cigarettes  . Smokeless tobacco: Never Used  Substance Use Topics  . Alcohol use: No  . Drug use: No    Family Medical History: History reviewed. No pertinent family history.  Physical Examination: Vitals:   11/25/17 0556 11/25/17 0851  BP: (!) 174/90 (!) 168/81  Pulse: 86 90  Resp: (!) 22   Temp: 98.5 F (36.9 C)   SpO2: 99%      General: Patient is well developed, well nourished, calm, collected, and in no apparent distress.   Psychiatric: Patient is non-anxious.  Head:  Pupils equal, round, and reactive to light.  ENT:  Oral mucosa appears well hydrated.  Neck:   Supple.  Full range of motion.  Respiratory: Patient is breathing without any difficulty.  Extremities: No edema.  Vascular: Palpable pulses in dorsal pedal vessels.  Skin:   Multiple areas of edema and ecchymosis on face  NEUROLOGICAL:   General: In no acute distress.   Awake, alert, oriented to person, place, and time.  Pupils equal round and reactive to light.  Facial tone is symmetric.  Tongue protrusion is midline.  There is no pronator drift. CNI   Strength: Side Biceps Triceps Deltoid Interossei Grip Wrist Ext. Wrist Flex.  R 5 5 5 5 5 5 5   L 5 5 5 5 5 5 5    Side Iliopsoas Quads Hamstring PF DF EHL  R 5 5 5 5 5 5   L 5 5 5 5 5 5    Reflexes are 2+ and symmetric at the biceps, triceps, brachioradialis, patella and achilles.   Bilateral upper and lower extremity sensation is intact to light touch and pin prick.  Clonus is not present.  Toes are down-going.    Hoffman's is absent.  Imaging: CT HEAD WITHOUT CONTRAST 11/24/2017  21:29  FINDINGS: CT HEAD FINDINGS  Brain: Diffuse cerebral atrophy. Ventricular dilatation consistent with central atrophy. Low-attenuation changes throughout the deep  white matter consistent with small vessel ischemia. Focal areas of acute hemorrhage demonstrated in the right sylvian fissure and along the anterior right tentorium extending to the paramedian as the phalanx cistern. These are probably subarachnoid hemorrhages. The largest hemorrhagic focus is over the tentorium and measures about 1 x 1.6 cm. There is no mass effect or midline shift. Gray-white matter junctions are distinct. Basal cisterns are not effaced.  Vascular: Intracranial arterial vascular calcifications are present.  Skull: The calvarium appears intact. No acute depressed skull fractures identified.  Other: Subcutaneous scalp hematoma over the anterior frontal region. Soft tissue gas in this area consistent with penetrating injury.  CT HEAD WITHOUT CONTRAST 11/25/2017  03:36  FINDINGS: Brain: No acute territorial infarction or intracranial mass is visualized. Small focus of subarachnoid hemorrhage within the right sylvian fissure, no change. Similar size and appearance of right tentorial hemorrhage  measuring approximately 16 x 9 mm. Small amount subarachnoid hemorrhage adjacent to the right brainstem. No midline shift. Atrophy and significant small vessel ischemic changes of the white matter. Stable ventricle size.  Vascular: No hyperdense vessel.  Carotid vascular calcification  Skull: No fracture  Sinuses/Orbits: No acute finding.  Other: Forehead scalp laceration and hematoma. Assessment and Plan: Ms. Childers is a pleasant 84 y.o. female with subarachnoid hemorrhage from traumatic event.  Repeat CT has revealed stabilization of bleed.  Recommend continued observation and repeat head CT in the morning due to daily use of Eliquis and aspirin.  Hold DVT prophylaxis until repeat head CT.  Recommend holding Eliquis and aspirin for 10 days.  Continue neuro exams to monitor any potential decline in neurologic function.   If imaging reveals continued stabilization of bleed, patient is cleared from neurosurgery.  No surgical intervention needed at this time, patient cleared from NPO status.  She may follow-up in clinic in 2 to 3 weeks to monitor progress.   Marin Olp, PA-C Dept. of Neurosurgery

## 2017-11-25 NOTE — ED Notes (Signed)
Patient transported to CT 

## 2017-11-25 NOTE — H&P (Addendum)
Kathryn Cain is an 84 y.o. female.   Chief Complaint: Fall HPI: The patient with past medical history of atrial fibrillation, breast cancer, coronary artery disease, hypertension and COPD presents to the emergency department after a mechanical fall.  The patient suffered multiple traumas to her head and face.  CT of her head showed a small subarachnoid hemorrhage.  Neurosurgery was contacted and advised following up 6 hours later to show extension or stability of bleed.  The patient also had acute on chronic renal insufficiency which prompted the emergency department staff, hospitalist service for admission.  Past Medical History:  Diagnosis Date  . A-fib ([place])   . Anginal pain ([place])   . Arthritis   . Cancer (HCC)    BREAST  . Chronic renal insufficiency   . COPD (chronic obstructive pulmonary disease) ([place])   . Coronary artery disease   . Depression   . Diverticulosis   . Gout   . HOH (hard of hearing)   . Hyperlipidemia   . Hypertension   . Myocardial infarction ([place])   . Nephrolithiasis   . Peripheral vascular disease ([place])   . Stroke [facility])    2013    Past Surgical History:  Procedure Laterality Date  . AMPUTATION Right 04/02/2016   Procedure: AMPUTATION FINGER TIP;  Surgeon: Hessie Knows, MD;  Location: [facility] ORS;  Service: Orthopedics;  Laterality: Right;  . APPENDECTOMY    . BREAST SURGERY Right   . CARDIAC SURGERY    . CATARACT EXTRACTION W/PHACO Right 07/08/2017   Procedure: CATARACT EXTRACTION PHACO AND INTRAOCULAR LENS PLACEMENT (IOC);  Surgeon: Eulogio Bear, MD;  Location: [facility] ORS;  Service: Ophthalmology;  Laterality: Right;  fluid lot #[id number]  exp 02/01/2019 [place]   00:32.2 AP%   12.7 CDE  4.08   . COLONOSCOPY WITH PROPOFOL N/A 07/16/2017   Procedure: COLONOSCOPY WITH PROPOFOL;  Surgeon: [name], MD;  Location: [facility] ENDOSCOPY;  Service: Endoscopy;  Laterality: N/A;  . CORONARY ARTERY BYPASS GRAFT     2013  . DISTAL INTERPHALANGEAL JOINT FUSION Right  11/28/2015   Procedure: DISTAL INTERPHALANGEAL JOINT FUSION;  Surgeon: Hessie Knows, MD;  Location: [facility] ORS;  Service: Orthopedics;  Laterality: Right;  third finger  . ESOPHAGOGASTRODUODENOSCOPY (EGD) WITH PROPOFOL N/A 07/16/2017   Procedure: ESOPHAGOGASTRODUODENOSCOPY (EGD) WITH PROPOFOL;  Surgeon: [name], MD;  Location: [facility] ENDOSCOPY;  Service: Endoscopy;  Laterality: N/A;  . KIDNEY STONE SURGERY    . MASTECTOMY Right    1986    History reviewed. No pertinent family history.  The patient is unable to contribute to her history at this time.  Social History:  reports that she has been smoking cigarettes.  She has been smoking about 0.50 packs per day. She has never used smokeless tobacco. She reports that she does not drink alcohol or use drugs.  Allergies:  Allergies  Allergen Reactions  . Amiodarone Itching  . Ciprofloxacin Hcl Other (See Comments)    Unknown reaction  . Codeine Itching  . Eliquis [Apixaban]     Medications Prior to Admission  Medication Sig Dispense Refill  . allopurinol (ZYLOPRIM) 100 MG tablet Take 1 tablet (100 mg total) by mouth daily. 30 tablet 0  . amLODipine (NORVASC) 5 MG tablet Take 5 mg by mouth daily.     Marland Kitchen aspirin 81 MG tablet Take 81 mg by mouth daily.    . citalopram (CELEXA) 20 MG tablet Take 20 mg by mouth daily.    . diphenhydramine-acetaminophen (TYLENOL PM) 25-500  MG TABS tablet Take 1 tablet by mouth at bedtime as needed (for sleep).     Marland Kitchen ELIQUIS 5 MG TABS tablet Take 5 mg by mouth 2 (two) times daily.  5  . furosemide (LASIX) 40 MG tablet Take 40 mg by mouth daily as needed for fluid.     . hydrOXYzine (ATARAX/VISTARIL) 25 MG tablet TAKE 0.5 TABLETS (12.5 MG TOTAL) BY MOUTH 3 (THREE) TIMES DAILY AS NEEDED FOR ITCHING  0  . Iron-Vitamins (GERITOL PO) Take 1 tablet by mouth daily.     . metoprolol tartrate (LOPRESSOR) 25 MG tablet Take 25 mg by mouth daily.     . Omega-3 Fatty Acids (FISH OIL PO) Take 1 capsule by mouth daily.    .  pantoprazole (PROTONIX) 40 MG tablet Take 1 tablet (40 mg total) by mouth daily. 30 tablet 2  . pravastatin (PRAVACHOL) 80 MG tablet Take 80 mg by mouth at bedtime.     . DUREZOL 0.05 % EMUL Apply 1 drop to eye 4 (four) times daily. Begin after surgery (07/08/2017)  0  . levothyroxine (SYNTHROID, LEVOTHROID) 25 MCG tablet PLEASE SEE ATTACHED FOR DETAILED DIRECTIONS  11  . PROAIR HFA 108 (90 Base) MCG/ACT inhaler Inhale 2 puffs into the lungs every 6 (six) hours as needed for shortness of breath or wheezing.  99  . traMADol (ULTRAM) 50 MG tablet Take by mouth every 8 (eight) hours as needed.    . traZODone (DESYREL) 50 MG tablet Take 50 mg by mouth at bedtime.      Results for orders placed or performed during the hospital encounter of 11/24/17 (from the past 48 hour(s))  Basic metabolic panel     Status: Abnormal   Collection Time: 11/24/17  8:29 PM  Result Value Ref Range   Sodium 137 135 - 145 mmol/L   Potassium 5.3 (H) 3.5 - 5.1 mmol/L   Chloride 109 98 - 111 mmol/L   CO2 18 (L) 22 - 32 mmol/L   Glucose, Bld 130 (H) 70 - 99 mg/dL   BUN 64 (H) 8 - 23 mg/dL   Creatinine, Ser 5.82 (H) 0.44 - 1.00 mg/dL   Calcium 8.8 (L) 8.9 - 10.3 mg/dL   GFR calc non Af Amer 6 (L) >60 mL/min   GFR calc Af Amer 7 (L) >60 mL/min    Comment: (NOTE) The eGFR has been calculated using the CKD EPI equation. This calculation has not been validated in all clinical situations. eGFR's persistently <60 mL/min signify possible Chronic Kidney Disease.    Anion gap 10 5 - 15    Comment: Performed at [facility], [address] [place], [place] [address]  CBC     Status: Abnormal   Collection Time: 11/24/17  8:29 PM  Result Value Ref Range   WBC 6.5 3.6 - 11.0 K/uL   RBC 2.36 (L) 3.80 - 5.20 MIL/uL   Hemoglobin 7.6 (L) 12.0 - 16.0 g/dL   HCT 22.5 (L) 35.0 - 47.0 %   MCV 95.5 80.0 - 100.0 fL   MCH 32.3 26.0 - 34.0 pg   MCHC 33.9 32.0 - 36.0 g/dL   RDW 13.2 11.5 - 14.5 %   Platelets 156 150 - 440  K/uL    Comment: Performed at [facility], [address] [place], [place] [address]  Troponin I     Status: Abnormal   Collection Time: 11/24/17  8:29 PM  Result Value Ref Range   Troponin I 0.04 (HH) <  0.03 ng/mL    Comment: CRITICAL RESULT CALLED TO, READ BACK BY AND VERIFIED WITH NICOLE COOK ON 11/24/17 AT [address] [facility] Performed at [facility], [address] [place], [place] [address]   TSH     Status: Abnormal   Collection Time: 11/25/17  6:12 AM  Result Value Ref Range   TSH 4.558 (H) 0.350 - 4.500 uIU/mL    Comment: Performed by a 3rd Generation assay with a functional sensitivity of <=0.01 uIU/mL. Performed at [facility], [address], [place],  [address]    Dg Shoulder Right  Result Date: 11/24/2017 CLINICAL DATA:  Fall. EXAM: RIGHT SHOULDER - 2+ VIEW COMPARISON:  None. FINDINGS: There is no evidence of fracture or dislocation. Glenohumeral joint appears normal. Moderate narrowing of subacromial space is noted suggesting rotator cuff injury. Right axillary surgical clips are noted. IMPRESSION: Moderate narrowing of subacromial space is noted suggesting rotator cuff injury. No fracture or dislocation is noted. Electronically Signed   By: Marijo Conception, M.D.   On: 11/24/2017 21:48   Dg Elbow 2 Views Left  Result Date: 11/24/2017 CLINICAL DATA:  Left elbow pain after fall. EXAM: LEFT ELBOW - 2 VIEW COMPARISON:  None. FINDINGS: There is no evidence of fracture, dislocation, or joint effusion. There is no evidence of arthropathy or other focal bone abnormality. Soft tissues are unremarkable. IMPRESSION: Normal left elbow. Electronically Signed   By: Marijo Conception, M.D.   On: 11/24/2017 21:49   Ct Head Wo Contrast  Result Date: 11/25/2017 CLINICAL DATA:  Headache EXAM: CT HEAD WITHOUT CONTRAST TECHNIQUE: Contiguous axial images were obtained from the base of the skull through the vertex without intravenous contrast. COMPARISON:  11/24/2017  head CT FINDINGS: Brain: No acute territorial infarction or intracranial mass is visualized. Small focus of subarachnoid hemorrhage within the right sylvian fissure, no change. Similar size and appearance of right tentorial hemorrhage measuring approximately 16 x 9 mm. Small amount subarachnoid hemorrhage adjacent to the right brainstem. No midline shift. Atrophy and significant small vessel ischemic changes of the white matter. Stable ventricle size. Vascular: No hyperdense vessel.  Carotid vascular calcification Skull: No fracture Sinuses/Orbits: No acute finding. Other: Forehead scalp laceration and hematoma. IMPRESSION: 1. Overall no significant interval change in size of small focus of subarachnoid hemorrhage in the right sylvian fissure, right tentorial hematoma, or small amount of extra-axial blood adjacent to the right brainstem. No midline shift. No significant mass effect. 2. Atrophy with small vessel ischemic changes of the white matter. 3. Forehead laceration and hematoma Electronically Signed   By: Donavan Foil M.D.   On: 11/25/2017 03:36   Ct Head Wo Contrast  Result Date: 11/24/2017 CLINICAL DATA:  Unwitnessed fall. Forehead pain and hematoma. Laceration to the nose. EXAM: CT HEAD WITHOUT CONTRAST CT MAXILLOFACIAL WITHOUT CONTRAST CT CERVICAL SPINE WITHOUT CONTRAST TECHNIQUE: Multidetector CT imaging of the head, cervical spine, and maxillofacial structures were performed using the standard protocol without intravenous contrast. Multiplanar CT image reconstructions of the cervical spine and maxillofacial structures were also generated. COMPARISON:  None. FINDINGS: CT HEAD FINDINGS Brain: Diffuse cerebral atrophy. Ventricular dilatation consistent with central atrophy. Low-attenuation changes throughout the deep white matter consistent with small vessel ischemia. Focal areas of acute hemorrhage demonstrated in the right sylvian fissure and along the anterior right tentorium extending to the  paramedian as the phalanx cistern. These are probably subarachnoid hemorrhages. The largest hemorrhagic focus is over the tentorium and measures about 1 x 1.6 cm. There  is no mass effect or midline shift. Gray-white matter junctions are distinct. Basal cisterns are not effaced. Vascular: Intracranial arterial vascular calcifications are present. Skull: The calvarium appears intact. No acute depressed skull fractures identified. Other: Subcutaneous scalp hematoma over the anterior frontal region. Soft tissue gas in this area consistent with penetrating injury. CT MAXILLOFACIAL FINDINGS Osseous: Depressed fractures of the anterior nasal bones. Frontal bones, orbital rims, maxillary antral walls, pterygoid plates, zygomatic arches, mandibles, and temporomandibular joints appear intact. Degenerative changes are present in the temporomandibular joints. The patient is edentulous. Orbits: The globes and extraocular muscles appear intact and symmetrical. No retrobulbar infiltration or hematoma. Sinuses: Paranasal sinuses and mastoid air cells are clear. Soft tissues: Soft tissue subcutaneous hematoma over the forehead and nose. CT CERVICAL SPINE FINDINGS Alignment: There is about 5 mm anterior subluxation of C4 on C5. Without comparison, it is difficult to tell whether this is acute or chronic. Degenerative changes suggested this may be due to chronic degenerative process but ligamentous injury or instability are not excluded by this study. Correlation with physical examination is recommended. Normal alignment of the facet joints. C1-2 articulation appears intact. Skull base and vertebrae: Skull base is intact. Mild chronic appearing compression of C5 and C6 anteriorly. No focal bone lesion or bone destruction. Bone cortex appears intact. Soft tissues and spinal canal: No prevertebral soft tissue swelling. No paraspinal soft tissue mass or infiltration. Vascular calcifications. Disc levels: Degenerative changes in the  cervical spine with disc space narrowing and endplate hypertrophic changes throughout but more prominent at C4-5, C5-6, C6-7, and C7-T1 levels. Degenerative changes at C1-2. Upper chest: Emphysematous changes and fibrocalcific scarring, likely postinflammatory, are present in the lung apices. Other: None. IMPRESSION: 1. Foci of acute intracranial hemorrhage in the right sylvian fissure and along the right anterior tentorium with extension to the median cephalic cisterns. This is likely subarachnoid hemorrhage. No mass effect or midline shift. Underlying chronic atrophy and small vessel ischemic changes. 2. Acute comminuted depressed nasal bone fractures with associated soft tissue swelling over the forehead and nasal region. No other orbital or facial fractures identified. 3. 5 mm anterior subluxation of C4 on C5 is indeterminate. This may be degenerative but acute ligamentous injury is not excluded. Recommend correlation with physical examination. If there is clinical suspicion of ligamentous injury, MRI would be more sensitive for evaluation. 4. Emphysematous changes and fibrocalcific scarring in the lung apices. These results were called by telephone at the time of interpretation on 11/24/2017 at 9:24 pm to Dr. Arta Silence , who verbally acknowledged these results. Electronically Signed   By: Lucienne Capers M.D.   On: 11/24/2017 21:29   Ct Cervical Spine Wo Contrast  Result Date: 11/24/2017 CLINICAL DATA:  Unwitnessed fall. Forehead pain and hematoma. Laceration to the nose. EXAM: CT HEAD WITHOUT CONTRAST CT MAXILLOFACIAL WITHOUT CONTRAST CT CERVICAL SPINE WITHOUT CONTRAST TECHNIQUE: Multidetector CT imaging of the head, cervical spine, and maxillofacial structures were performed using the standard protocol without intravenous contrast. Multiplanar CT image reconstructions of the cervical spine and maxillofacial structures were also generated. COMPARISON:  None. FINDINGS: CT HEAD FINDINGS Brain:  Diffuse cerebral atrophy. Ventricular dilatation consistent with central atrophy. Low-attenuation changes throughout the deep white matter consistent with small vessel ischemia. Focal areas of acute hemorrhage demonstrated in the right sylvian fissure and along the anterior right tentorium extending to the paramedian as the phalanx cistern. These are probably subarachnoid hemorrhages. The largest hemorrhagic focus is over the tentorium and measures about 1 x 1.6  cm. There is no mass effect or midline shift. Gray-white matter junctions are distinct. Basal cisterns are not effaced. Vascular: Intracranial arterial vascular calcifications are present. Skull: The calvarium appears intact. No acute depressed skull fractures identified. Other: Subcutaneous scalp hematoma over the anterior frontal region. Soft tissue gas in this area consistent with penetrating injury. CT MAXILLOFACIAL FINDINGS Osseous: Depressed fractures of the anterior nasal bones. Frontal bones, orbital rims, maxillary antral walls, pterygoid plates, zygomatic arches, mandibles, and temporomandibular joints appear intact. Degenerative changes are present in the temporomandibular joints. The patient is edentulous. Orbits: The globes and extraocular muscles appear intact and symmetrical. No retrobulbar infiltration or hematoma. Sinuses: Paranasal sinuses and mastoid air cells are clear. Soft tissues: Soft tissue subcutaneous hematoma over the forehead and nose. CT CERVICAL SPINE FINDINGS Alignment: There is about 5 mm anterior subluxation of C4 on C5. Without comparison, it is difficult to tell whether this is acute or chronic. Degenerative changes suggested this may be due to chronic degenerative process but ligamentous injury or instability are not excluded by this study. Correlation with physical examination is recommended. Normal alignment of the facet joints. C1-2 articulation appears intact. Skull base and vertebrae: Skull base is intact. Mild  chronic appearing compression of C5 and C6 anteriorly. No focal bone lesion or bone destruction. Bone cortex appears intact. Soft tissues and spinal canal: No prevertebral soft tissue swelling. No paraspinal soft tissue mass or infiltration. Vascular calcifications. Disc levels: Degenerative changes in the cervical spine with disc space narrowing and endplate hypertrophic changes throughout but more prominent at C4-5, C5-6, C6-7, and C7-T1 levels. Degenerative changes at C1-2. Upper chest: Emphysematous changes and fibrocalcific scarring, likely postinflammatory, are present in the lung apices. Other: None. IMPRESSION: 1. Foci of acute intracranial hemorrhage in the right sylvian fissure and along the right anterior tentorium with extension to the median cephalic cisterns. This is likely subarachnoid hemorrhage. No mass effect or midline shift. Underlying chronic atrophy and small vessel ischemic changes. 2. Acute comminuted depressed nasal bone fractures with associated soft tissue swelling over the forehead and nasal region. No other orbital or facial fractures identified. 3. 5 mm anterior subluxation of C4 on C5 is indeterminate. This may be degenerative but acute ligamentous injury is not excluded. Recommend correlation with physical examination. If there is clinical suspicion of ligamentous injury, MRI would be more sensitive for evaluation. 4. Emphysematous changes and fibrocalcific scarring in the lung apices. These results were called by telephone at the time of interpretation on 11/24/2017 at 9:24 pm to Dr. Arta Silence , who verbally acknowledged these results. Electronically Signed   By: Lucienne Capers M.D.   On: 11/24/2017 21:29   Ct Maxillofacial Wo Contrast  Result Date: 11/24/2017 CLINICAL DATA:  Unwitnessed fall. Forehead pain and hematoma. Laceration to the nose. EXAM: CT HEAD WITHOUT CONTRAST CT MAXILLOFACIAL WITHOUT CONTRAST CT CERVICAL SPINE WITHOUT CONTRAST TECHNIQUE: Multidetector CT  imaging of the head, cervical spine, and maxillofacial structures were performed using the standard protocol without intravenous contrast. Multiplanar CT image reconstructions of the cervical spine and maxillofacial structures were also generated. COMPARISON:  None. FINDINGS: CT HEAD FINDINGS Brain: Diffuse cerebral atrophy. Ventricular dilatation consistent with central atrophy. Low-attenuation changes throughout the deep white matter consistent with small vessel ischemia. Focal areas of acute hemorrhage demonstrated in the right sylvian fissure and along the anterior right tentorium extending to the paramedian as the phalanx cistern. These are probably subarachnoid hemorrhages. The largest hemorrhagic focus is over the tentorium and measures about 1 x  1.6 cm. There is no mass effect or midline shift. Gray-white matter junctions are distinct. Basal cisterns are not effaced. Vascular: Intracranial arterial vascular calcifications are present. Skull: The calvarium appears intact. No acute depressed skull fractures identified. Other: Subcutaneous scalp hematoma over the anterior frontal region. Soft tissue gas in this area consistent with penetrating injury. CT MAXILLOFACIAL FINDINGS Osseous: Depressed fractures of the anterior nasal bones. Frontal bones, orbital rims, maxillary antral walls, pterygoid plates, zygomatic arches, mandibles, and temporomandibular joints appear intact. Degenerative changes are present in the temporomandibular joints. The patient is edentulous. Orbits: The globes and extraocular muscles appear intact and symmetrical. No retrobulbar infiltration or hematoma. Sinuses: Paranasal sinuses and mastoid air cells are clear. Soft tissues: Soft tissue subcutaneous hematoma over the forehead and nose. CT CERVICAL SPINE FINDINGS Alignment: There is about 5 mm anterior subluxation of C4 on C5. Without comparison, it is difficult to tell whether this is acute or chronic. Degenerative changes suggested  this may be due to chronic degenerative process but ligamentous injury or instability are not excluded by this study. Correlation with physical examination is recommended. Normal alignment of the facet joints. C1-2 articulation appears intact. Skull base and vertebrae: Skull base is intact. Mild chronic appearing compression of C5 and C6 anteriorly. No focal bone lesion or bone destruction. Bone cortex appears intact. Soft tissues and spinal canal: No prevertebral soft tissue swelling. No paraspinal soft tissue mass or infiltration. Vascular calcifications. Disc levels: Degenerative changes in the cervical spine with disc space narrowing and endplate hypertrophic changes throughout but more prominent at C4-5, C5-6, C6-7, and C7-T1 levels. Degenerative changes at C1-2. Upper chest: Emphysematous changes and fibrocalcific scarring, likely postinflammatory, are present in the lung apices. Other: None. IMPRESSION: 1. Foci of acute intracranial hemorrhage in the right sylvian fissure and along the right anterior tentorium with extension to the median cephalic cisterns. This is likely subarachnoid hemorrhage. No mass effect or midline shift. Underlying chronic atrophy and small vessel ischemic changes. 2. Acute comminuted depressed nasal bone fractures with associated soft tissue swelling over the forehead and nasal region. No other orbital or facial fractures identified. 3. 5 mm anterior subluxation of C4 on C5 is indeterminate. This may be degenerative but acute ligamentous injury is not excluded. Recommend correlation with physical examination. If there is clinical suspicion of ligamentous injury, MRI would be more sensitive for evaluation. 4. Emphysematous changes and fibrocalcific scarring in the lung apices. These results were called by telephone at the time of interpretation on 11/24/2017 at 9:24 pm to Dr. Arta Silence , who verbally acknowledged these results. Electronically Signed   By: Lucienne Capers M.D.    On: 11/24/2017 21:29    Review of Systems  Unable to perform ROS: Medical condition    Blood pressure (!) 174/90, pulse 86, temperature 98.5 F (36.9 C), temperature source Oral, resp. rate (!) 22, height 5' 3"  (1.6 m), weight 57.8 kg (127 lb 8 oz), SpO2 99 %. Physical Exam  Vitals reviewed. Constitutional: She appears well-developed and well-nourished. No distress.  HENT:  Head: Normocephalic and atraumatic.  Mouth/Throat: Oropharynx is clear and moist.  Orbital hematomas as well as laceration to the forehead and contusion above brow  Eyes: Pupils are equal, round, and reactive to light. Conjunctivae and EOM are normal. No scleral icterus.  Neck: Normal range of motion. Neck supple. No JVD present. No tracheal deviation present. No thyromegaly present.  Cardiovascular: Normal rate, regular rhythm and normal heart sounds. Exam reveals no gallop and no friction  rub.  No murmur heard. Respiratory: Effort normal and breath sounds normal.  GI: Soft. Bowel sounds are normal. She exhibits no distension. There is no tenderness.  Genitourinary:  Genitourinary Comments: Deferred  Musculoskeletal: Normal range of motion. She exhibits no edema.  Lymphadenopathy:    She has no cervical adenopathy.  Neurological: No cranial nerve deficit. She exhibits normal muscle tone.  Somnolent  Skin: Skin is warm and dry. No rash noted. No erythema.  Psychiatric:  The patient is somnolent and difficult to gauge psychologically she is in pain and reticent to answer questions     Assessment/Plan This is an 84 year old female admitted for acute on chronic kidney disease. 1.  Renal insufficiency: Acute on chronic; hydrate with intravenous fluid.  Avoid nephrotoxic agents. 2.  Subarachnoid hemorrhage: Emergency department has discussed with neurosurgery the safety and keeping the patient at our facility given its stable size on CT scan 6 hours apart.  Continue to monitor for indications of increased  intracranial pressure.  Hold all anticoagulation 3.  CAD: Mild elevation in troponin.  Continue to follow cardiac biomarkers.  Monitor telemetry.  No aspirin for now 4.  Hypertension: Uncontrolled; continue amlodipine.  Labetalol as needed. 5.  Atrial fibrillation: Rate controlled; hold Eliquis 6.  Hypothyroidism: Continue Synthroid.  Check TSH 7.  DVT prophylaxis: SCDs 8.  GI prophylaxis: Pantoprazole per home regimen The patient is a full code.  Time spent on admission orders and patient care approximately 45 minutes  Harrie Foreman, MD 11/25/2017, 7:03 AM

## 2017-11-25 NOTE — Progress Notes (Signed)
Patient admitted this morning for acute kidney injury and small subarachnoid hemorrhage. Patient seen by neurosurgery this morning.  CT head is stable.  Continue current plan by admitting MD.  Follow BMP. Consult nephrology

## 2017-11-25 NOTE — Plan of Care (Signed)

## 2017-11-26 ENCOUNTER — Inpatient Hospital Stay: Payer: Medicare HMO

## 2017-11-26 LAB — URINALYSIS, COMPLETE (UACMP) WITH MICROSCOPIC
Bilirubin Urine: NEGATIVE
Glucose, UA: NEGATIVE mg/dL
Hgb urine dipstick: NEGATIVE
Ketones, ur: NEGATIVE mg/dL
Leukocytes, UA: NEGATIVE
Nitrite: NEGATIVE
Protein, ur: 100 mg/dL — AB
Specific Gravity, Urine: 1.012 (ref 1.005–1.030)
pH: 6 (ref 5.0–8.0)

## 2017-11-26 LAB — BASIC METABOLIC PANEL
Anion gap: 6 (ref 5–15)
BUN: 56 mg/dL — ABNORMAL HIGH (ref 8–23)
CHLORIDE: 114 mmol/L — AB (ref 98–111)
CO2: 18 mmol/L — ABNORMAL LOW (ref 22–32)
CREATININE: 5.31 mg/dL — AB (ref 0.44–1.00)
Calcium: 7.9 mg/dL — ABNORMAL LOW (ref 8.9–10.3)
GFR, EST AFRICAN AMERICAN: 8 mL/min — AB (ref >60)
GFR, EST NON AFRICAN AMERICAN: 7 mL/min — AB (ref >60)
Glucose, Bld: 92 mg/dL (ref 70–99)
POTASSIUM: 5.3 mmol/L — AB (ref 3.5–5.1)
SODIUM: 138 mmol/L (ref 135–145)

## 2017-11-26 LAB — CBC
HCT: 18.8 % — ABNORMAL LOW (ref 35.0–47.0)
HEMOGLOBIN: 6.2 g/dL — AB (ref 12.0–16.0)
MCH: 31.8 pg (ref 26.0–34.0)
MCHC: 32.9 g/dL (ref 32.0–36.0)
MCV: 96.8 fL (ref 80.0–100.0)
PLATELETS: 131 10*3/uL — AB (ref 150–440)
RBC: 1.94 MIL/uL — AB (ref 3.80–5.20)
RDW: 13.3 % (ref 11.5–14.5)
WBC: 4.9 10*3/uL (ref 3.6–11.0)

## 2017-11-26 LAB — PREPARE RBC (CROSSMATCH)

## 2017-11-26 MED ORDER — METOPROLOL TARTRATE 5 MG/5ML IV SOLN
5.0000 mg | Freq: Four times a day (QID) | INTRAVENOUS | Status: DC | PRN
  Administered 2017-11-27 (×2): 5 mg via INTRAVENOUS
  Filled 2017-11-26 (×2): qty 5

## 2017-11-26 MED ORDER — SODIUM CHLORIDE 0.9% IV SOLUTION
Freq: Once | INTRAVENOUS | Status: AC
  Administered 2017-11-26: 15:00:00 via INTRAVENOUS

## 2017-11-26 MED ORDER — AMLODIPINE BESYLATE 5 MG PO TABS
5.0000 mg | ORAL_TABLET | Freq: Once | ORAL | Status: AC
  Administered 2017-11-26: 5 mg via ORAL
  Filled 2017-11-26: qty 1

## 2017-11-26 MED ORDER — AMLODIPINE BESYLATE 10 MG PO TABS
10.0000 mg | ORAL_TABLET | Freq: Every day | ORAL | Status: DC
  Administered 2017-11-27 – 2017-11-28 (×2): 10 mg via ORAL
  Filled 2017-11-26 (×2): qty 1

## 2017-11-26 NOTE — Progress Notes (Signed)
PT Cancellation Note  Patient Details Name: Kathryn Cain MRN: [id number] DOB: 09/17/1933   Cancelled Treatment:    Reason Eval/Treat Not Completed: Medical issues which prohibited therapy(Consult received and chart reviewed.  Patient noted with decreased HgB (curently 6.2); pending blood transfusion this date.  Will hold exertional activity and re-attempt at later time/date as medically appropriate.)   [name], PT, DPT, NCS 11/26/17, 10:58 AM [phone]

## 2017-11-26 NOTE — Progress Notes (Signed)
[place] [facility] [place] NAME: Kathryn Cain    MR#:  [id number]  DATE OF BIRTH:  [birth year]  SUBJECTIVE:   Patient is hard of hearing.  No acute issues overnight.  Repeat CT scan this morning shows stable subarachnoid hemorrhage.  REVIEW OF SYSTEMS:    Review of Systems  Constitutional: Negative for fever, chills weight loss HENT: Negative for ear pain, nosebleeds, congestion, facial swelling, rhinorrhea, neck pain, neck stiffness and ear discharge.   Hard of hearing Respiratory: Negative for cough, shortness of breath, wheezing  Cardiovascular: Negative for chest pain, palpitations and leg swelling.  Gastrointestinal: Negative for heartburn, abdominal pain, vomiting, diarrhea or consitpation Genitourinary: Negative for dysuria, urgency, frequency, hematuria Musculoskeletal: Negative for back pain or joint pain Neurological: Negative for dizziness, seizures, syncope, focal weakness,  numbness and headaches.  Hematological: Does not bruise/bleed easily.  Psychiatric/Behavioral: Negative for hallucinations, confusion, dysphoric mood    Tolerating Diet:yes      DRUG ALLERGIES:   Allergies  Allergen Reactions  . Amiodarone Itching  . Ciprofloxacin Hcl Other (See Comments)    Unknown reaction  . Codeine Itching  . Eliquis [Apixaban]     VITALS:  Blood pressure (!) 153/83, pulse 95, temperature 98 F (36.7 C), temperature source Oral, resp. rate (!) 22, height 5\' 3"  (1.6 m), weight 127 lb 11.2 oz (57.9 kg), SpO2 95 %.  PHYSICAL EXAMINATION:  Constitutional: Appears well-developed and well-nourished. No distress. HENT: Normocephalic. Marland Kitchen Oropharynx is clear and moist.  Eyes: Conjunctivae and EOM are normal. PERRLA, no scleral icterus.  Orbital ecchymosis and laceration to the forehead Neck: Normal ROM. Neck supple. No JVD. No tracheal deviation. CVS: RRR, S1/S2 +, no murmurs, no gallops, no carotid bruit.  Pulmonary: Effort and  breath sounds normal, no stridor, rhonchi, wheezes, rales.  Abdominal: Soft. BS +,  no distension, tenderness, rebound or guarding.  Musculoskeletal: Normal range of motion. No edema and no tenderness.  Neuro: Alert. CN 2-12 grossly intact. No focal deficits.  Generalized weakness Skin: Skin is warm and dry. No rash noted. Psychiatric: Normal mood and affect.      LABORATORY PANEL:   CBC Recent Labs  Lab 11/26/17 0814  WBC 4.9  HGB 6.2*  HCT 18.8*  PLT 131*   ------------------------------------------------------------------------------------------------------------------  Chemistries  Recent Labs  Lab 11/24/17 2029  NA 137  K 5.3*  CL 109  CO2 18*  GLUCOSE 130*  BUN 64*  CREATININE 5.82*  CALCIUM 8.8*   ------------------------------------------------------------------------------------------------------------------  Cardiac Enzymes Recent Labs  Lab 11/24/17 2029  TROPONINI 0.04*   ------------------------------------------------------------------------------------------------------------------  RADIOLOGY:  Dg Shoulder Right  Result Date: 11/24/2017 CLINICAL DATA:  Fall. EXAM: RIGHT SHOULDER - 2+ VIEW COMPARISON:  None. FINDINGS: There is no evidence of fracture or dislocation. Glenohumeral joint appears normal. Moderate narrowing of subacromial space is noted suggesting rotator cuff injury. Right axillary surgical clips are noted. IMPRESSION: Moderate narrowing of subacromial space is noted suggesting rotator cuff injury. No fracture or dislocation is noted. Electronically Signed   By: Marijo Conception, M.D.   On: 11/24/2017 21:48   Dg Elbow 2 Views Left  Result Date: 11/24/2017 CLINICAL DATA:  Left elbow pain after fall. EXAM: LEFT ELBOW - 2 VIEW COMPARISON:  None. FINDINGS: There is no evidence of fracture, dislocation, or joint effusion. There is no evidence of arthropathy or other focal bone abnormality. Soft tissues are unremarkable. IMPRESSION: Normal left  elbow. Electronically Signed   By: Marijo Conception, M.D.  On: 11/24/2017 21:49   Ct Head Wo Contrast  Result Date: 11/25/2017 CLINICAL DATA:  Headache EXAM: CT HEAD WITHOUT CONTRAST TECHNIQUE: Contiguous axial images were obtained from the base of the skull through the vertex without intravenous contrast. COMPARISON:  11/24/2017 head CT FINDINGS: Brain: No acute territorial infarction or intracranial mass is visualized. Small focus of subarachnoid hemorrhage within the right sylvian fissure, no change. Similar size and appearance of right tentorial hemorrhage measuring approximately 16 x 9 mm. Small amount subarachnoid hemorrhage adjacent to the right brainstem. No midline shift. Atrophy and significant small vessel ischemic changes of the white matter. Stable ventricle size. Vascular: No hyperdense vessel.  Carotid vascular calcification Skull: No fracture Sinuses/Orbits: No acute finding. Other: Forehead scalp laceration and hematoma. IMPRESSION: 1. Overall no significant interval change in size of small focus of subarachnoid hemorrhage in the right sylvian fissure, right tentorial hematoma, or small amount of extra-axial blood adjacent to the right brainstem. No midline shift. No significant mass effect. 2. Atrophy with small vessel ischemic changes of the white matter. 3. Forehead laceration and hematoma Electronically Signed   By: Donavan Foil M.D.   On: 11/25/2017 03:36   Ct Head Wo Contrast  Result Date: 11/24/2017 CLINICAL DATA:  Unwitnessed fall. Forehead pain and hematoma. Laceration to the nose. EXAM: CT HEAD WITHOUT CONTRAST CT MAXILLOFACIAL WITHOUT CONTRAST CT CERVICAL SPINE WITHOUT CONTRAST TECHNIQUE: Multidetector CT imaging of the head, cervical spine, and maxillofacial structures were performed using the standard protocol without intravenous contrast. Multiplanar CT image reconstructions of the cervical spine and maxillofacial structures were also generated. COMPARISON:  None. FINDINGS: CT  HEAD FINDINGS Brain: Diffuse cerebral atrophy. Ventricular dilatation consistent with central atrophy. Low-attenuation changes throughout the deep white matter consistent with small vessel ischemia. Focal areas of acute hemorrhage demonstrated in the right sylvian fissure and along the anterior right tentorium extending to the paramedian as the phalanx cistern. These are probably subarachnoid hemorrhages. The largest hemorrhagic focus is over the tentorium and measures about 1 x 1.6 cm. There is no mass effect or midline shift. Gray-white matter junctions are distinct. Basal cisterns are not effaced. Vascular: Intracranial arterial vascular calcifications are present. Skull: The calvarium appears intact. No acute depressed skull fractures identified. Other: Subcutaneous scalp hematoma over the anterior frontal region. Soft tissue gas in this area consistent with penetrating injury. CT MAXILLOFACIAL FINDINGS Osseous: Depressed fractures of the anterior nasal bones. Frontal bones, orbital rims, maxillary antral walls, pterygoid plates, zygomatic arches, mandibles, and temporomandibular joints appear intact. Degenerative changes are present in the temporomandibular joints. The patient is edentulous. Orbits: The globes and extraocular muscles appear intact and symmetrical. No retrobulbar infiltration or hematoma. Sinuses: Paranasal sinuses and mastoid air cells are clear. Soft tissues: Soft tissue subcutaneous hematoma over the forehead and nose. CT CERVICAL SPINE FINDINGS Alignment: There is about 5 mm anterior subluxation of C4 on C5. Without comparison, it is difficult to tell whether this is acute or chronic. Degenerative changes suggested this may be due to chronic degenerative process but ligamentous injury or instability are not excluded by this study. Correlation with physical examination is recommended. Normal alignment of the facet joints. C1-2 articulation appears intact. Skull base and vertebrae: Skull base  is intact. Mild chronic appearing compression of C5 and C6 anteriorly. No focal bone lesion or bone destruction. Bone cortex appears intact. Soft tissues and spinal canal: No prevertebral soft tissue swelling. No paraspinal soft tissue mass or infiltration. Vascular calcifications. Disc levels: Degenerative changes in the cervical spine  with disc space narrowing and endplate hypertrophic changes throughout but more prominent at C4-5, C5-6, C6-7, and C7-T1 levels. Degenerative changes at C1-2. Upper chest: Emphysematous changes and fibrocalcific scarring, likely postinflammatory, are present in the lung apices. Other: None. IMPRESSION: 1. Foci of acute intracranial hemorrhage in the right sylvian fissure and along the right anterior tentorium with extension to the median cephalic cisterns. This is likely subarachnoid hemorrhage. No mass effect or midline shift. Underlying chronic atrophy and small vessel ischemic changes. 2. Acute comminuted depressed nasal bone fractures with associated soft tissue swelling over the forehead and nasal region. No other orbital or facial fractures identified. 3. 5 mm anterior subluxation of C4 on C5 is indeterminate. This may be degenerative but acute ligamentous injury is not excluded. Recommend correlation with physical examination. If there is clinical suspicion of ligamentous injury, MRI would be more sensitive for evaluation. 4. Emphysematous changes and fibrocalcific scarring in the lung apices. These results were called by telephone at the time of interpretation on 11/24/2017 at 9:24 pm to Dr. Arta Silence , who verbally acknowledged these results. Electronically Signed   By: Lucienne Capers M.D.   On: 11/24/2017 21:29   Ct Cervical Spine Wo Contrast  Result Date: 11/24/2017 CLINICAL DATA:  Unwitnessed fall. Forehead pain and hematoma. Laceration to the nose. EXAM: CT HEAD WITHOUT CONTRAST CT MAXILLOFACIAL WITHOUT CONTRAST CT CERVICAL SPINE WITHOUT CONTRAST TECHNIQUE:  Multidetector CT imaging of the head, cervical spine, and maxillofacial structures were performed using the standard protocol without intravenous contrast. Multiplanar CT image reconstructions of the cervical spine and maxillofacial structures were also generated. COMPARISON:  None. FINDINGS: CT HEAD FINDINGS Brain: Diffuse cerebral atrophy. Ventricular dilatation consistent with central atrophy. Low-attenuation changes throughout the deep white matter consistent with small vessel ischemia. Focal areas of acute hemorrhage demonstrated in the right sylvian fissure and along the anterior right tentorium extending to the paramedian as the phalanx cistern. These are probably subarachnoid hemorrhages. The largest hemorrhagic focus is over the tentorium and measures about 1 x 1.6 cm. There is no mass effect or midline shift. Gray-white matter junctions are distinct. Basal cisterns are not effaced. Vascular: Intracranial arterial vascular calcifications are present. Skull: The calvarium appears intact. No acute depressed skull fractures identified. Other: Subcutaneous scalp hematoma over the anterior frontal region. Soft tissue gas in this area consistent with penetrating injury. CT MAXILLOFACIAL FINDINGS Osseous: Depressed fractures of the anterior nasal bones. Frontal bones, orbital rims, maxillary antral walls, pterygoid plates, zygomatic arches, mandibles, and temporomandibular joints appear intact. Degenerative changes are present in the temporomandibular joints. The patient is edentulous. Orbits: The globes and extraocular muscles appear intact and symmetrical. No retrobulbar infiltration or hematoma. Sinuses: Paranasal sinuses and mastoid air cells are clear. Soft tissues: Soft tissue subcutaneous hematoma over the forehead and nose. CT CERVICAL SPINE FINDINGS Alignment: There is about 5 mm anterior subluxation of C4 on C5. Without comparison, it is difficult to tell whether this is acute or chronic. Degenerative  changes suggested this may be due to chronic degenerative process but ligamentous injury or instability are not excluded by this study. Correlation with physical examination is recommended. Normal alignment of the facet joints. C1-2 articulation appears intact. Skull base and vertebrae: Skull base is intact. Mild chronic appearing compression of C5 and C6 anteriorly. No focal bone lesion or bone destruction. Bone cortex appears intact. Soft tissues and spinal canal: No prevertebral soft tissue swelling. No paraspinal soft tissue mass or infiltration. Vascular calcifications. Disc levels: Degenerative changes in the  cervical spine with disc space narrowing and endplate hypertrophic changes throughout but more prominent at C4-5, C5-6, C6-7, and C7-T1 levels. Degenerative changes at C1-2. Upper chest: Emphysematous changes and fibrocalcific scarring, likely postinflammatory, are present in the lung apices. Other: None. IMPRESSION: 1. Foci of acute intracranial hemorrhage in the right sylvian fissure and along the right anterior tentorium with extension to the median cephalic cisterns. This is likely subarachnoid hemorrhage. No mass effect or midline shift. Underlying chronic atrophy and small vessel ischemic changes. 2. Acute comminuted depressed nasal bone fractures with associated soft tissue swelling over the forehead and nasal region. No other orbital or facial fractures identified. 3. 5 mm anterior subluxation of C4 on C5 is indeterminate. This may be degenerative but acute ligamentous injury is not excluded. Recommend correlation with physical examination. If there is clinical suspicion of ligamentous injury, MRI would be more sensitive for evaluation. 4. Emphysematous changes and fibrocalcific scarring in the lung apices. These results were called by telephone at the time of interpretation on 11/24/2017 at 9:24 pm to Dr. Arta Silence , who verbally acknowledged these results. Electronically Signed   By:  Lucienne Capers M.D.   On: 11/24/2017 21:29   Ct Maxillofacial Wo Contrast  Result Date: 11/24/2017 CLINICAL DATA:  Unwitnessed fall. Forehead pain and hematoma. Laceration to the nose. EXAM: CT HEAD WITHOUT CONTRAST CT MAXILLOFACIAL WITHOUT CONTRAST CT CERVICAL SPINE WITHOUT CONTRAST TECHNIQUE: Multidetector CT imaging of the head, cervical spine, and maxillofacial structures were performed using the standard protocol without intravenous contrast. Multiplanar CT image reconstructions of the cervical spine and maxillofacial structures were also generated. COMPARISON:  None. FINDINGS: CT HEAD FINDINGS Brain: Diffuse cerebral atrophy. Ventricular dilatation consistent with central atrophy. Low-attenuation changes throughout the deep white matter consistent with small vessel ischemia. Focal areas of acute hemorrhage demonstrated in the right sylvian fissure and along the anterior right tentorium extending to the paramedian as the phalanx cistern. These are probably subarachnoid hemorrhages. The largest hemorrhagic focus is over the tentorium and measures about 1 x 1.6 cm. There is no mass effect or midline shift. Gray-white matter junctions are distinct. Basal cisterns are not effaced. Vascular: Intracranial arterial vascular calcifications are present. Skull: The calvarium appears intact. No acute depressed skull fractures identified. Other: Subcutaneous scalp hematoma over the anterior frontal region. Soft tissue gas in this area consistent with penetrating injury. CT MAXILLOFACIAL FINDINGS Osseous: Depressed fractures of the anterior nasal bones. Frontal bones, orbital rims, maxillary antral walls, pterygoid plates, zygomatic arches, mandibles, and temporomandibular joints appear intact. Degenerative changes are present in the temporomandibular joints. The patient is edentulous. Orbits: The globes and extraocular muscles appear intact and symmetrical. No retrobulbar infiltration or hematoma. Sinuses: Paranasal  sinuses and mastoid air cells are clear. Soft tissues: Soft tissue subcutaneous hematoma over the forehead and nose. CT CERVICAL SPINE FINDINGS Alignment: There is about 5 mm anterior subluxation of C4 on C5. Without comparison, it is difficult to tell whether this is acute or chronic. Degenerative changes suggested this may be due to chronic degenerative process but ligamentous injury or instability are not excluded by this study. Correlation with physical examination is recommended. Normal alignment of the facet joints. C1-2 articulation appears intact. Skull base and vertebrae: Skull base is intact. Mild chronic appearing compression of C5 and C6 anteriorly. No focal bone lesion or bone destruction. Bone cortex appears intact. Soft tissues and spinal canal: No prevertebral soft tissue swelling. No paraspinal soft tissue mass or infiltration. Vascular calcifications. Disc levels: Degenerative changes in  the cervical spine with disc space narrowing and endplate hypertrophic changes throughout but more prominent at C4-5, C5-6, C6-7, and C7-T1 levels. Degenerative changes at C1-2. Upper chest: Emphysematous changes and fibrocalcific scarring, likely postinflammatory, are present in the lung apices. Other: None. IMPRESSION: 1. Foci of acute intracranial hemorrhage in the right sylvian fissure and along the right anterior tentorium with extension to the median cephalic cisterns. This is likely subarachnoid hemorrhage. No mass effect or midline shift. Underlying chronic atrophy and small vessel ischemic changes. 2. Acute comminuted depressed nasal bone fractures with associated soft tissue swelling over the forehead and nasal region. No other orbital or facial fractures identified. 3. 5 mm anterior subluxation of C4 on C5 is indeterminate. This may be degenerative but acute ligamentous injury is not excluded. Recommend correlation with physical examination. If there is clinical suspicion of ligamentous injury, MRI would  be more sensitive for evaluation. 4. Emphysematous changes and fibrocalcific scarring in the lung apices. These results were called by telephone at the time of interpretation on 11/24/2017 at 9:24 pm to Dr. Arta Silence , who verbally acknowledged these results. Electronically Signed   By: Lucienne Capers M.D.   On: 11/24/2017 21:29     ASSESSMENT AND PLAN:   84 year old female with chronic kidney disease stage IV, PAF on anticoagulation who presented after a fall and found to have subarachnoid hemorrhage.  1.  Acute on chronic kidney disease stage IV: BMP for a.m. Pending Nephrology consultation requested Hold nephrotoxic medications  2.  Subarachnoid hemorrhage with acute blood loss anemia on chronic anemia: Patient evaluated by neurosurgery. CT scan x3 have been stable Neurosurgery recommending holding aspirin and Eliquis for 10 days. No surgical intervention needed at this time. Follow-up with neurosurgery in 2 to 3 weeks  Hemoglobin dropped this morning probably from ecchymosis and small subarachnoid hemorrhage Transfuse 1 unit CBC for a.m.   3.  PAF:  Continue metoprolol for heart rate control Holding Eliquis and aspirin  4.  Essential hypertension: Continue Norvasc and metoprolol  5.  CAD: Continue pravastatin and metoprolol  6.  Depression: Continue Celexa  7.  Hypothyroidism: Continue Synthroid   PT and palliative care consultation requested  Management plans discussed with the patient and she is in agreement.  CODE STATUS: FULL  TOTAL TIME TAKING CARE OF THIS PATIENT: 28 minutes.     POSSIBLE D/C 1-2 days, DEPENDING ON CLINICAL CONDITION.   Merrill Deanda M.D on 11/26/2017 at 8:59 AM  Between 7am to 6pm - Pager - [phone] After 6pm go to [email or web address] - password [email or web address] [facility]  Office  [phone]  CC: Primary care physician; Valera Castle, MD  Note: This dictation was prepared with Dragon dictation along  with smaller phrase technology. Any transcriptional errors that result from this process are unintentional.

## 2017-11-26 NOTE — Care Management Note (Signed)
Case Management Note  Patient Details  Name: Kathryn Cain MRN: [id number] Date of Birth: [birth year]  Subjective/Objective:                  Admitted to [facility] with the diagnosis of acute kidney injury. Lives with son, Kathryn Cain, [phone]). Last seen Dr, Kym Groom 10/06/17. No home Health. No skilled facility. No home oxygen. Usually takes care of all basic activities of daily living herself. Family helps with errands. Fair appetite. [place] prior to this admission. NPO Neurosurgeon consult  Action/Plan: Received referral for home health needs. Will continue to follow.   Expected Discharge Date:                  Expected Discharge Plan:     In-House Referral:     Discharge planning Services     Post Acute Care Choice:    Choice offered to:     DME Arranged:    DME Agency:     HH Arranged:    HH Agency:     Status of Service:     If discussed at [employer], dates discussed:    Additional Comments:  Shelbie Ammons, RN MSN CCM Care Management [phone] 11/26/2017, 8:08 AM

## 2017-11-26 NOTE — Progress Notes (Signed)
Palliative Medicine Consult Order Noted. Due to high referral volume, there will be a delay seeing this patient. Palliative Medicine Provider will return to [facility] on 11/29/17, and patient will be evaluated then. If patient discharges before they are seen, an outpatient palliative referral may be appropriate.   Please call the Palliative Medicine Team office at [phone] if recommendations are needed in the interim.  Thank you for inviting us to see this patient.  No charge note.  Marionette Meskill Lee Castle Lamons, DNP, AGNP-C Palliative Medicine Team Team Phone # [phone]  Pager # [phone]  

## 2017-11-26 NOTE — Consult Note (Signed)
Date: 11/26/2017                  Patient Name:  Kathryn Cain  MRN: [id number]  DOB: 05/12/1933  Age / Sex: 84 y.o., female         PCP: Valera Castle, MD                 Service Requesting Consult: IM/ Bettey Costa, MD                 Reason for Consult: ARF            History of Present Illness: Patient is a 84 y.o. female with medical problems of atrial fibrillation, coronary disease, COPD, hypertension, who was admitted to [facility] on 11/24/2017 for evaluation after a fall.  Patient is very hard of hearing and is not able to provide any meaningful information.  All information is obtained from the chart. CT scan of the head showed small subarachnoid hemorrhage.  Patient was evaluated by neurosurgery. Lab results show high creatinine of 5.8 which has improved slightly to 5.3 with iv fluids Baseline cr appears to be 3.04 / GFR 16 Hgb dropped to 6.2 Nephrology evaluation requested for evaluation   Medications: Outpatient medications: Medications Prior to Admission  Medication Sig Dispense Refill Last Dose  . allopurinol (ZYLOPRIM) 100 MG tablet Take 1 tablet (100 mg total) by mouth daily. 30 tablet 0 Unknown at Unknown  . amLODipine (NORVASC) 5 MG tablet Take 5 mg by mouth daily.    Unknown at Unknown  . aspirin 81 MG tablet Take 81 mg by mouth daily.   Unknown at Unknown  . citalopram (CELEXA) 20 MG tablet Take 20 mg by mouth daily.   Unknown at Unknown  . diphenhydramine-acetaminophen (TYLENOL PM) 25-500 MG TABS tablet Take 1 tablet by mouth at bedtime as needed (for sleep).    prn at prn  . ELIQUIS 5 MG TABS tablet Take 5 mg by mouth 2 (two) times daily.  5 Unknown at Unknown  . furosemide (LASIX) 40 MG tablet Take 40 mg by mouth daily as needed for fluid.    prn at prn  . hydrOXYzine (ATARAX/VISTARIL) 25 MG tablet TAKE 0.5 TABLETS (12.5 MG TOTAL) BY MOUTH 3 (THREE) TIMES DAILY AS NEEDED FOR ITCHING  0 prn at prn  . Iron-Vitamins (GERITOL PO) Take 1 tablet by mouth  daily.    Unknown at Unknown  . metoprolol tartrate (LOPRESSOR) 25 MG tablet Take 25 mg by mouth daily.    Unknown at Unknown  . Omega-3 Fatty Acids (FISH OIL PO) Take 1 capsule by mouth daily.   Unknown at Unknown  . pantoprazole (PROTONIX) 40 MG tablet Take 1 tablet (40 mg total) by mouth daily. 30 tablet 2 Unknown at Unknown  . pravastatin (PRAVACHOL) 80 MG tablet Take 80 mg by mouth at bedtime.    Unknown at Unknown  . DUREZOL 0.05 % EMUL Apply 1 drop to eye 4 (four) times daily. Begin after surgery (07/08/2017)  0 Not Taking at Unknown time  . levothyroxine (SYNTHROID, LEVOTHROID) 25 MCG tablet PLEASE SEE ATTACHED FOR DETAILED DIRECTIONS  11 Unknown at Unknown  . PROAIR HFA 108 (90 Base) MCG/ACT inhaler Inhale 2 puffs into the lungs every 6 (six) hours as needed for shortness of breath or wheezing.  99 Not Taking at Unknown time  . traMADol (ULTRAM) 50 MG tablet Take by mouth every 8 (eight) hours as needed.   Not Taking at  Unknown time  . traZODone (DESYREL) 50 MG tablet Take 50 mg by mouth at bedtime.   Not Taking at Unknown time    Current medications: Current Facility-Administered Medications  Medication Dose Route Frequency Provider Last Rate Last Dose  . 0.9 %  sodium chloride infusion (Manually program via Guardrails IV Fluids)   Intravenous Once Mody, Sital, MD      . 0.9 %  sodium chloride infusion   Intravenous Continuous Harrie Foreman, MD 125 mL/hr at 11/26/17 [phone]    . acetaminophen (TYLENOL) tablet 650 mg  650 mg Oral Q6H PRN Harrie Foreman, MD   650 mg at 11/26/17 0631   Or  . acetaminophen (TYLENOL) suppository 650 mg  650 mg Rectal Q6H PRN Harrie Foreman, MD      . diphenhydrAMINE (BENADRYL) capsule 25 mg  25 mg Oral QHS PRN Harrie Foreman, MD   25 mg at 11/25/17 2228   Or  . acetaminophen (TYLENOL) tablet 500 mg  500 mg Oral QHS PRN Harrie Foreman, MD      . amLODipine (NORVASC) tablet 5 mg  5 mg Oral Daily Harrie Foreman, MD   5 mg at 11/25/17 0858   . citalopram (CELEXA) tablet 20 mg  20 mg Oral Daily Harrie Foreman, MD   20 mg at 11/25/17 0858  . Difluprednate 0.05 % EMUL 1 drop  1 drop Ophthalmic QID Harrie Foreman, MD      . docusate sodium (COLACE) capsule 100 mg  100 mg Oral BID Harrie Foreman, MD   100 mg at 11/25/17 2157  . furosemide (LASIX) tablet 40 mg  40 mg Oral Daily PRN Harrie Foreman, MD      . hydrOXYzine (ATARAX/VISTARIL) tablet 25 mg  25 mg Oral TID PRN Harrie Foreman, MD      . levothyroxine (SYNTHROID, LEVOTHROID) tablet 25 mcg  25 mcg Oral QAC breakfast Harrie Foreman, MD   25 mcg at 11/26/17 [phone]  . metoprolol tartrate (LOPRESSOR) tablet 25 mg  25 mg Oral Daily Harrie Foreman, MD   25 mg at 11/25/17 0858  . ondansetron (ZOFRAN) tablet 4 mg  4 mg Oral Q6H PRN Harrie Foreman, MD       Or  . ondansetron [facility]) injection 4 mg  4 mg Intravenous Q6H PRN Harrie Foreman, MD      . pantoprazole (PROTONIX) EC tablet 40 mg  40 mg Oral Daily Harrie Foreman, MD   40 mg at 11/25/17 0858  . pravastatin (PRAVACHOL) tablet 80 mg  80 mg Oral QHS Harrie Foreman, MD   80 mg at 11/25/17 2058  . traZODone (DESYREL) tablet 50 mg  50 mg Oral QHS Harrie Foreman, MD   50 mg at 11/25/17 2057      Allergies: Allergies  Allergen Reactions  . Amiodarone Itching  . Ciprofloxacin Hcl Other (See Comments)    Unknown reaction  . Codeine Itching  . Eliquis [Apixaban]       Past Medical History: Past Medical History:  Diagnosis Date  . A-fib ([place])   . Anginal pain ([place])   . Arthritis   . Cancer (HCC)    BREAST  . Chronic renal insufficiency   . COPD (chronic obstructive pulmonary disease) ([place])   . Coronary artery disease   . Depression   . Diverticulosis   . Gout   . HOH (hard of hearing)   . Hyperlipidemia   .  Hypertension   . Myocardial infarction ([place])   . Nephrolithiasis   . Peripheral vascular disease ([place])   . Stroke [facility])    2013     Past Surgical History: Past  Surgical History:  Procedure Laterality Date  . AMPUTATION Right 04/02/2016   Procedure: AMPUTATION FINGER TIP;  Surgeon: Hessie Knows, MD;  Location: [facility] ORS;  Service: Orthopedics;  Laterality: Right;  . APPENDECTOMY    . BREAST SURGERY Right   . CARDIAC SURGERY    . CATARACT EXTRACTION W/PHACO Right 07/08/2017   Procedure: CATARACT EXTRACTION PHACO AND INTRAOCULAR LENS PLACEMENT (IOC);  Surgeon: Eulogio Bear, MD;  Location: [facility] ORS;  Service: Ophthalmology;  Laterality: Right;  fluid lot #[id number]  exp 02/01/2019 [place]   00:32.2 AP%   12.7 CDE  4.08   . COLONOSCOPY WITH PROPOFOL N/A 07/16/2017   Procedure: COLONOSCOPY WITH PROPOFOL;  Surgeon: [name], MD;  Location: [facility] ENDOSCOPY;  Service: Endoscopy;  Laterality: N/A;  . CORONARY ARTERY BYPASS GRAFT     2013  . DISTAL INTERPHALANGEAL JOINT FUSION Right 11/28/2015   Procedure: DISTAL INTERPHALANGEAL JOINT FUSION;  Surgeon: Hessie Knows, MD;  Location: [facility] ORS;  Service: Orthopedics;  Laterality: Right;  third finger  . ESOPHAGOGASTRODUODENOSCOPY (EGD) WITH PROPOFOL N/A 07/16/2017   Procedure: ESOPHAGOGASTRODUODENOSCOPY (EGD) WITH PROPOFOL;  Surgeon: [name], MD;  Location: [facility] ENDOSCOPY;  Service: Endoscopy;  Laterality: N/A;  . KIDNEY STONE SURGERY    . MASTECTOMY Right    1986     Family History: History reviewed. No pertinent family history.   Social History: Social History   Socioeconomic History  . Marital status: Widowed    Spouse name: Not on file  . Number of children: Not on file  . Years of education: Not on file  . Highest education level: Not on file  Occupational History  . Occupation: retired  Scientific laboratory technician  . Financial resource strain: Not on file  . Food insecurity:    Worry: Not on file    Inability: Not on file  . Transportation needs:    Medical: Not on file    Non-medical: Not on file  Tobacco Use  . Smoking status: Current Some Day Smoker    Packs/day: 0.50    Types: Cigarettes   . Smokeless tobacco: Never Used  Substance and Sexual Activity  . Alcohol use: No  . Drug use: No  . Sexual activity: Not on file  Lifestyle  . Physical activity:    Days per week: Not on file    Minutes per session: Not on file  . Stress: Not on file  Relationships  . Social connections:    Talks on phone: Not on file    Gets together: Not on file    Attends religious service: Not on file    Active member of club or organization: Not on file    Attends meetings of clubs or organizations: Not on file    Relationship status: Not on file  . Intimate partner violence:    Fear of current or ex partner: Not on file    Emotionally abused: Not on file    Physically abused: Not on file    Forced sexual activity: Not on file  Other Topics Concern  . Not on file  Social History Narrative  . Not on file     Review of Systems: Gen:  HEENT:  CV:  Resp:  GI: GU :  MS:  Derm:   Psych: Heme:  Neuro:  Endocrine  Vital Signs: Blood pressure (!) 153/83, pulse 95, temperature 98 F (36.7 C), temperature source Oral, resp. rate (!) 22, height 5\' 3"  (1.6 m), weight 57.9 kg (127 lb 11.2 oz), SpO2 95 %.   Intake/Output Summary (Last 24 hours) at 11/26/2017 1053 Last data filed at 11/26/2017 1029 Gross per 24 hour  Intake 2951.67 ml  Output 400 ml  Net 2551.67 ml    Weight trends: [employer]   11/24/17 2024 11/25/17 0556 11/26/17 0436  Weight: 59 kg (130 lb) 57.8 kg (127 lb 8 oz) 57.9 kg (127 lb 11.2 oz)    Physical Exam: General:  NAD laying in bed  HEENT Multiple bruises on face, decreased hearing  Neck:  supple  Lungs: Clear to auscultation  Heart::  2/6 systolic murmur, irregular  Abdomen: Soft, NT  Extremities:  Trace edema  Neurologic: Alert, able to follow simple commands  Skin: No acute rashes             Lab results: Basic Metabolic Panel: Recent Labs  Lab 11/24/17 2029 11/26/17 0814  NA 137 138  K 5.3* 5.3*  CL 109 114*  CO2 18* 18*   GLUCOSE 130* 92  BUN 64* 56*  CREATININE 5.82* 5.31*  CALCIUM 8.8* 7.9*    Liver Function Tests: No results for input(s): AST, ALT, ALKPHOS, BILITOT, PROT, ALBUMIN in the last 168 hours. No results for input(s): LIPASE, AMYLASE in the last 168 hours. No results for input(s): AMMONIA in the last 168 hours.  CBC: Recent Labs  Lab 11/24/17 2029 11/26/17 0814  WBC 6.5 4.9  HGB 7.6* 6.2*  HCT 22.5* 18.8*  MCV 95.5 96.8  PLT 156 131*    Cardiac Enzymes: Recent Labs  Lab 11/24/17 2029  TROPONINI 0.04*    BNP: Invalid input(s): POCBNP  CBG: No results for input(s): GLUCAP in the last 168 hours.  Microbiology: No results found for this or any previous visit (from the past 720 hour(s)).   Coagulation Studies: No results for input(s): LABPROT, INR in the last 72 hours.  Urinalysis: Recent Labs    11/26/17 0430  COLORURINE YELLOW*  LABSPEC 1.012  PHURINE 6.0  GLUCOSEU NEGATIVE  HGBUR NEGATIVE  BILIRUBINUR NEGATIVE  KETONESUR NEGATIVE  PROTEINUR 100*  NITRITE NEGATIVE  LEUKOCYTESUR NEGATIVE        Imaging: Dg Shoulder Right  Result Date: 11/24/2017 CLINICAL DATA:  Fall. EXAM: RIGHT SHOULDER - 2+ VIEW COMPARISON:  None. FINDINGS: There is no evidence of fracture or dislocation. Glenohumeral joint appears normal. Moderate narrowing of subacromial space is noted suggesting rotator cuff injury. Right axillary surgical clips are noted. IMPRESSION: Moderate narrowing of subacromial space is noted suggesting rotator cuff injury. No fracture or dislocation is noted. Electronically Signed   By: Marijo Conception, M.D.   On: 11/24/2017 21:48   Dg Elbow 2 Views Left  Result Date: 11/24/2017 CLINICAL DATA:  Left elbow pain after fall. EXAM: LEFT ELBOW - 2 VIEW COMPARISON:  None. FINDINGS: There is no evidence of fracture, dislocation, or joint effusion. There is no evidence of arthropathy or other focal bone abnormality. Soft tissues are unremarkable. IMPRESSION: Normal  left elbow. Electronically Signed   By: Marijo Conception, M.D.   On: 11/24/2017 21:49   Ct Head Wo Contrast  Result Date: 11/25/2017 CLINICAL DATA:  Headache EXAM: CT HEAD WITHOUT CONTRAST TECHNIQUE: Contiguous axial images were obtained from the base of the skull through the vertex without intravenous contrast. COMPARISON:  11/24/2017 head CT FINDINGS:  Brain: No acute territorial infarction or intracranial mass is visualized. Small focus of subarachnoid hemorrhage within the right sylvian fissure, no change. Similar size and appearance of right tentorial hemorrhage measuring approximately 16 x 9 mm. Small amount subarachnoid hemorrhage adjacent to the right brainstem. No midline shift. Atrophy and significant small vessel ischemic changes of the white matter. Stable ventricle size. Vascular: No hyperdense vessel.  Carotid vascular calcification Skull: No fracture Sinuses/Orbits: No acute finding. Other: Forehead scalp laceration and hematoma. IMPRESSION: 1. Overall no significant interval change in size of small focus of subarachnoid hemorrhage in the right sylvian fissure, right tentorial hematoma, or small amount of extra-axial blood adjacent to the right brainstem. No midline shift. No significant mass effect. 2. Atrophy with small vessel ischemic changes of the white matter. 3. Forehead laceration and hematoma Electronically Signed   By: Donavan Foil M.D.   On: 11/25/2017 03:36   Ct Head Wo Contrast  Result Date: 11/24/2017 CLINICAL DATA:  Unwitnessed fall. Forehead pain and hematoma. Laceration to the nose. EXAM: CT HEAD WITHOUT CONTRAST CT MAXILLOFACIAL WITHOUT CONTRAST CT CERVICAL SPINE WITHOUT CONTRAST TECHNIQUE: Multidetector CT imaging of the head, cervical spine, and maxillofacial structures were performed using the standard protocol without intravenous contrast. Multiplanar CT image reconstructions of the cervical spine and maxillofacial structures were also generated. COMPARISON:  None.  FINDINGS: CT HEAD FINDINGS Brain: Diffuse cerebral atrophy. Ventricular dilatation consistent with central atrophy. Low-attenuation changes throughout the deep white matter consistent with small vessel ischemia. Focal areas of acute hemorrhage demonstrated in the right sylvian fissure and along the anterior right tentorium extending to the paramedian as the phalanx cistern. These are probably subarachnoid hemorrhages. The largest hemorrhagic focus is over the tentorium and measures about 1 x 1.6 cm. There is no mass effect or midline shift. Gray-white matter junctions are distinct. Basal cisterns are not effaced. Vascular: Intracranial arterial vascular calcifications are present. Skull: The calvarium appears intact. No acute depressed skull fractures identified. Other: Subcutaneous scalp hematoma over the anterior frontal region. Soft tissue gas in this area consistent with penetrating injury. CT MAXILLOFACIAL FINDINGS Osseous: Depressed fractures of the anterior nasal bones. Frontal bones, orbital rims, maxillary antral walls, pterygoid plates, zygomatic arches, mandibles, and temporomandibular joints appear intact. Degenerative changes are present in the temporomandibular joints. The patient is edentulous. Orbits: The globes and extraocular muscles appear intact and symmetrical. No retrobulbar infiltration or hematoma. Sinuses: Paranasal sinuses and mastoid air cells are clear. Soft tissues: Soft tissue subcutaneous hematoma over the forehead and nose. CT CERVICAL SPINE FINDINGS Alignment: There is about 5 mm anterior subluxation of C4 on C5. Without comparison, it is difficult to tell whether this is acute or chronic. Degenerative changes suggested this may be due to chronic degenerative process but ligamentous injury or instability are not excluded by this study. Correlation with physical examination is recommended. Normal alignment of the facet joints. C1-2 articulation appears intact. Skull base and  vertebrae: Skull base is intact. Mild chronic appearing compression of C5 and C6 anteriorly. No focal bone lesion or bone destruction. Bone cortex appears intact. Soft tissues and spinal canal: No prevertebral soft tissue swelling. No paraspinal soft tissue mass or infiltration. Vascular calcifications. Disc levels: Degenerative changes in the cervical spine with disc space narrowing and endplate hypertrophic changes throughout but more prominent at C4-5, C5-6, C6-7, and C7-T1 levels. Degenerative changes at C1-2. Upper chest: Emphysematous changes and fibrocalcific scarring, likely postinflammatory, are present in the lung apices. Other: None. IMPRESSION: 1. Foci of acute intracranial  hemorrhage in the right sylvian fissure and along the right anterior tentorium with extension to the median cephalic cisterns. This is likely subarachnoid hemorrhage. No mass effect or midline shift. Underlying chronic atrophy and small vessel ischemic changes. 2. Acute comminuted depressed nasal bone fractures with associated soft tissue swelling over the forehead and nasal region. No other orbital or facial fractures identified. 3. 5 mm anterior subluxation of C4 on C5 is indeterminate. This may be degenerative but acute ligamentous injury is not excluded. Recommend correlation with physical examination. If there is clinical suspicion of ligamentous injury, MRI would be more sensitive for evaluation. 4. Emphysematous changes and fibrocalcific scarring in the lung apices. These results were called by telephone at the time of interpretation on 11/24/2017 at 9:24 pm to Dr. Arta Silence , who verbally acknowledged these results. Electronically Signed   By: Lucienne Capers M.D.   On: 11/24/2017 21:29   Ct Cervical Spine Wo Contrast  Result Date: 11/24/2017 CLINICAL DATA:  Unwitnessed fall. Forehead pain and hematoma. Laceration to the nose. EXAM: CT HEAD WITHOUT CONTRAST CT MAXILLOFACIAL WITHOUT CONTRAST CT CERVICAL SPINE  WITHOUT CONTRAST TECHNIQUE: Multidetector CT imaging of the head, cervical spine, and maxillofacial structures were performed using the standard protocol without intravenous contrast. Multiplanar CT image reconstructions of the cervical spine and maxillofacial structures were also generated. COMPARISON:  None. FINDINGS: CT HEAD FINDINGS Brain: Diffuse cerebral atrophy. Ventricular dilatation consistent with central atrophy. Low-attenuation changes throughout the deep white matter consistent with small vessel ischemia. Focal areas of acute hemorrhage demonstrated in the right sylvian fissure and along the anterior right tentorium extending to the paramedian as the phalanx cistern. These are probably subarachnoid hemorrhages. The largest hemorrhagic focus is over the tentorium and measures about 1 x 1.6 cm. There is no mass effect or midline shift. Gray-white matter junctions are distinct. Basal cisterns are not effaced. Vascular: Intracranial arterial vascular calcifications are present. Skull: The calvarium appears intact. No acute depressed skull fractures identified. Other: Subcutaneous scalp hematoma over the anterior frontal region. Soft tissue gas in this area consistent with penetrating injury. CT MAXILLOFACIAL FINDINGS Osseous: Depressed fractures of the anterior nasal bones. Frontal bones, orbital rims, maxillary antral walls, pterygoid plates, zygomatic arches, mandibles, and temporomandibular joints appear intact. Degenerative changes are present in the temporomandibular joints. The patient is edentulous. Orbits: The globes and extraocular muscles appear intact and symmetrical. No retrobulbar infiltration or hematoma. Sinuses: Paranasal sinuses and mastoid air cells are clear. Soft tissues: Soft tissue subcutaneous hematoma over the forehead and nose. CT CERVICAL SPINE FINDINGS Alignment: There is about 5 mm anterior subluxation of C4 on C5. Without comparison, it is difficult to tell whether this is acute  or chronic. Degenerative changes suggested this may be due to chronic degenerative process but ligamentous injury or instability are not excluded by this study. Correlation with physical examination is recommended. Normal alignment of the facet joints. C1-2 articulation appears intact. Skull base and vertebrae: Skull base is intact. Mild chronic appearing compression of C5 and C6 anteriorly. No focal bone lesion or bone destruction. Bone cortex appears intact. Soft tissues and spinal canal: No prevertebral soft tissue swelling. No paraspinal soft tissue mass or infiltration. Vascular calcifications. Disc levels: Degenerative changes in the cervical spine with disc space narrowing and endplate hypertrophic changes throughout but more prominent at C4-5, C5-6, C6-7, and C7-T1 levels. Degenerative changes at C1-2. Upper chest: Emphysematous changes and fibrocalcific scarring, likely postinflammatory, are present in the lung apices. Other: None. IMPRESSION: 1. Foci of  acute intracranial hemorrhage in the right sylvian fissure and along the right anterior tentorium with extension to the median cephalic cisterns. This is likely subarachnoid hemorrhage. No mass effect or midline shift. Underlying chronic atrophy and small vessel ischemic changes. 2. Acute comminuted depressed nasal bone fractures with associated soft tissue swelling over the forehead and nasal region. No other orbital or facial fractures identified. 3. 5 mm anterior subluxation of C4 on C5 is indeterminate. This may be degenerative but acute ligamentous injury is not excluded. Recommend correlation with physical examination. If there is clinical suspicion of ligamentous injury, MRI would be more sensitive for evaluation. 4. Emphysematous changes and fibrocalcific scarring in the lung apices. These results were called by telephone at the time of interpretation on 11/24/2017 at 9:24 pm to Dr. Arta Silence , who verbally acknowledged these results.  Electronically Signed   By: Lucienne Capers M.D.   On: 11/24/2017 21:29   Ct Maxillofacial Wo Contrast  Result Date: 11/24/2017 CLINICAL DATA:  Unwitnessed fall. Forehead pain and hematoma. Laceration to the nose. EXAM: CT HEAD WITHOUT CONTRAST CT MAXILLOFACIAL WITHOUT CONTRAST CT CERVICAL SPINE WITHOUT CONTRAST TECHNIQUE: Multidetector CT imaging of the head, cervical spine, and maxillofacial structures were performed using the standard protocol without intravenous contrast. Multiplanar CT image reconstructions of the cervical spine and maxillofacial structures were also generated. COMPARISON:  None. FINDINGS: CT HEAD FINDINGS Brain: Diffuse cerebral atrophy. Ventricular dilatation consistent with central atrophy. Low-attenuation changes throughout the deep white matter consistent with small vessel ischemia. Focal areas of acute hemorrhage demonstrated in the right sylvian fissure and along the anterior right tentorium extending to the paramedian as the phalanx cistern. These are probably subarachnoid hemorrhages. The largest hemorrhagic focus is over the tentorium and measures about 1 x 1.6 cm. There is no mass effect or midline shift. Gray-white matter junctions are distinct. Basal cisterns are not effaced. Vascular: Intracranial arterial vascular calcifications are present. Skull: The calvarium appears intact. No acute depressed skull fractures identified. Other: Subcutaneous scalp hematoma over the anterior frontal region. Soft tissue gas in this area consistent with penetrating injury. CT MAXILLOFACIAL FINDINGS Osseous: Depressed fractures of the anterior nasal bones. Frontal bones, orbital rims, maxillary antral walls, pterygoid plates, zygomatic arches, mandibles, and temporomandibular joints appear intact. Degenerative changes are present in the temporomandibular joints. The patient is edentulous. Orbits: The globes and extraocular muscles appear intact and symmetrical. No retrobulbar infiltration or  hematoma. Sinuses: Paranasal sinuses and mastoid air cells are clear. Soft tissues: Soft tissue subcutaneous hematoma over the forehead and nose. CT CERVICAL SPINE FINDINGS Alignment: There is about 5 mm anterior subluxation of C4 on C5. Without comparison, it is difficult to tell whether this is acute or chronic. Degenerative changes suggested this may be due to chronic degenerative process but ligamentous injury or instability are not excluded by this study. Correlation with physical examination is recommended. Normal alignment of the facet joints. C1-2 articulation appears intact. Skull base and vertebrae: Skull base is intact. Mild chronic appearing compression of C5 and C6 anteriorly. No focal bone lesion or bone destruction. Bone cortex appears intact. Soft tissues and spinal canal: No prevertebral soft tissue swelling. No paraspinal soft tissue mass or infiltration. Vascular calcifications. Disc levels: Degenerative changes in the cervical spine with disc space narrowing and endplate hypertrophic changes throughout but more prominent at C4-5, C5-6, C6-7, and C7-T1 levels. Degenerative changes at C1-2. Upper chest: Emphysematous changes and fibrocalcific scarring, likely postinflammatory, are present in the lung apices. Other: None. IMPRESSION: 1. Foci  of acute intracranial hemorrhage in the right sylvian fissure and along the right anterior tentorium with extension to the median cephalic cisterns. This is likely subarachnoid hemorrhage. No mass effect or midline shift. Underlying chronic atrophy and small vessel ischemic changes. 2. Acute comminuted depressed nasal bone fractures with associated soft tissue swelling over the forehead and nasal region. No other orbital or facial fractures identified. 3. 5 mm anterior subluxation of C4 on C5 is indeterminate. This may be degenerative but acute ligamentous injury is not excluded. Recommend correlation with physical examination. If there is clinical suspicion of  ligamentous injury, MRI would be more sensitive for evaluation. 4. Emphysematous changes and fibrocalcific scarring in the lung apices. These results were called by telephone at the time of interpretation on 11/24/2017 at 9:24 pm to Dr. Arta Silence , who verbally acknowledged these results. Electronically Signed   By: Lucienne Capers M.D.   On: 11/24/2017 21:29      Assessment & Plan: Pt is a 84 y.o. african [place]  female with A Fib, COPD, HTN, CKD, was admitted on 11/24/2017 with fall and found to have sub arachnoid hemorrhage.   1. ARF on CKD st 4 Patient appears to have advanced CKD. Was not followed by nephrology Will obtain Renal u/s Continue iv hydration as patient appears clinically volume deplete If no improvement, may need to discuss dialysis in near future with family  Will follow       LOS: [place] 7/26/201910:53 AM  [place], [place]  Note: This note was prepared with Dragon dictation. Any transcription errors are unintentional

## 2017-11-27 LAB — CBC
HCT: 22.2 % — ABNORMAL LOW (ref 35.0–47.0)
HEMOGLOBIN: 7.4 g/dL — AB (ref 12.0–16.0)
MCH: 31.2 pg (ref 26.0–34.0)
MCHC: 33.6 g/dL (ref 32.0–36.0)
MCV: 92.9 fL (ref 80.0–100.0)
Platelets: 134 10*3/uL — ABNORMAL LOW (ref 150–440)
RBC: 2.39 MIL/uL — AB (ref 3.80–5.20)
RDW: 15.2 % — ABNORMAL HIGH (ref 11.5–14.5)
WBC: 5.6 10*3/uL (ref 3.6–11.0)

## 2017-11-27 LAB — BASIC METABOLIC PANEL
Anion gap: 5 (ref 5–15)
BUN: 52 mg/dL — ABNORMAL HIGH (ref 8–23)
CALCIUM: 7.7 mg/dL — AB (ref 8.9–10.3)
CO2: 17 mmol/L — ABNORMAL LOW (ref 22–32)
Chloride: 117 mmol/L — ABNORMAL HIGH (ref 98–111)
Creatinine, Ser: 4.84 mg/dL — ABNORMAL HIGH (ref 0.44–1.00)
GFR, EST AFRICAN AMERICAN: 9 mL/min — AB (ref >60)
GFR, EST NON AFRICAN AMERICAN: 8 mL/min — AB (ref >60)
GLUCOSE: 87 mg/dL (ref 70–99)
Potassium: 5.4 mmol/L — ABNORMAL HIGH (ref 3.5–5.1)
SODIUM: 139 mmol/L (ref 135–145)

## 2017-11-27 LAB — TYPE AND SCREEN
ABO/RH(D): O POS
Antibody Screen: POSITIVE
UNIT DIVISION: 0

## 2017-11-27 LAB — BPAM RBC
BLOOD PRODUCT EXPIRATION DATE: 201908172359
ISSUE DATE / TIME: 201907261507
UNIT TYPE AND RH: 5100

## 2017-11-27 LAB — PHOSPHORUS: PHOSPHORUS: 4.5 mg/dL (ref 2.5–4.6)

## 2017-11-27 MED ORDER — LABETALOL HCL 5 MG/ML IV SOLN
10.0000 mg | INTRAVENOUS | Status: DC | PRN
  Administered 2017-11-27: 21:00:00 10 mg via INTRAVENOUS
  Filled 2017-11-27: qty 4

## 2017-11-27 MED ORDER — HYDRALAZINE HCL 20 MG/ML IJ SOLN
10.0000 mg | INTRAMUSCULAR | Status: DC | PRN
  Administered 2017-11-27: 20:00:00 10 mg via INTRAVENOUS
  Filled 2017-11-27: qty 1

## 2017-11-27 MED ORDER — CLONIDINE HCL 0.1 MG PO TABS
0.1000 mg | ORAL_TABLET | Freq: Four times a day (QID) | ORAL | Status: DC | PRN
  Administered 2017-11-28: 06:00:00 0.1 mg via ORAL
  Filled 2017-11-27: qty 1

## 2017-11-27 MED ORDER — SODIUM POLYSTYRENE SULFONATE 15 GM/60ML PO SUSP
15.0000 g | Freq: Once | ORAL | Status: AC
  Administered 2017-11-27: 10:00:00 15 g via ORAL
  Filled 2017-11-27: qty 60

## 2017-11-27 MED ORDER — HYDRALAZINE HCL 20 MG/ML IJ SOLN
20.0000 mg | INTRAMUSCULAR | Status: DC | PRN
  Administered 2017-11-28: 01:00:00 20 mg via INTRAVENOUS
  Filled 2017-11-27: qty 1

## 2017-11-27 MED ORDER — HYDRALAZINE HCL 50 MG PO TABS
25.0000 mg | ORAL_TABLET | Freq: Two times a day (BID) | ORAL | Status: DC
  Administered 2017-11-27 – 2017-11-28 (×2): 25 mg via ORAL
  Filled 2017-11-27 (×2): qty 1

## 2017-11-27 MED ORDER — METOPROLOL TARTRATE 25 MG PO TABS
25.0000 mg | ORAL_TABLET | Freq: Two times a day (BID) | ORAL | Status: DC
  Administered 2017-11-27 – 2017-11-28 (×2): 25 mg via ORAL
  Filled 2017-11-27 (×2): qty 1

## 2017-11-27 MED ORDER — LABETALOL HCL 5 MG/ML IV SOLN
20.0000 mg | INTRAVENOUS | Status: DC | PRN
  Administered 2017-11-27: 20 mg via INTRAVENOUS
  Filled 2017-11-27: qty 4

## 2017-11-27 NOTE — Progress Notes (Signed)
[facility] [place] NAME: Kathryn Cain    MR#:  [id number]  DATE OF BIRTH:  [birth year]  SUBJECTIVE:   Patient is hard of hearing.  No acute issues overnight.  Had an episode of vomting Has generalized weakness Patient's blood pressure got elevated when she tried physical therapy  REVIEW OF SYSTEMS:    Review of Systems  Constitutional: Negative for fever, chills weight loss Has weakness HENT: Negative for ear pain, nosebleeds, congestion, facial swelling, rhinorrhea, neck pain, neck stiffness and ear discharge.   Hard of hearing Respiratory: Negative for cough, shortness of breath, wheezing  Cardiovascular: Negative for chest pain, palpitations and leg swelling.  Gastrointestinal: Negative for heartburn, abdominal pain, vomiting, diarrhea or consitpation Genitourinary: Negative for dysuria, urgency, frequency, hematuria Musculoskeletal: Negative for back pain or joint pain Neurological: Negative for dizziness, seizures, syncope, focal weakness,  numbness and headaches.  Hematological: Does not bruise/bleed easily.  Psychiatric/Behavioral: Negative for hallucinations, confusion, dysphoric mood    Tolerating Diet:yes      DRUG ALLERGIES:   Allergies  Allergen Reactions  . Amiodarone Itching  . Ciprofloxacin Hcl Other (See Comments)    Unknown reaction  . Codeine Itching  . Eliquis [Apixaban]     VITALS:  Blood pressure (!) 179/86, pulse 89, temperature 98.5 F (36.9 C), temperature source Oral, resp. rate 19, height 5\' 3"  (1.6 m), weight 61.8 kg (136 lb 3.9 oz), SpO2 95 %.  PHYSICAL EXAMINATION:  Constitutional: Appears well-developed and well-nourished. No distress. HENT: Normocephalic. Marland Kitchen Oropharynx is clear and moist.  Eyes: Conjunctivae and EOM are normal. PERRLA, no scleral icterus.  Orbital ecchymosis and laceration to the forehead Neck: Normal ROM. Neck supple. No JVD. No tracheal deviation. CVS: RRR, S1/S2 +,  no murmurs, no gallops, no carotid bruit.  Pulmonary: Effort and breath sounds normal, no stridor, rhonchi, wheezes, rales.  Abdominal: Soft. BS +,  no distension, tenderness, rebound or guarding.  Musculoskeletal: Normal range of motion. No edema and no tenderness.  Neuro: Alert. CN 2-12 grossly intact. No focal deficits.  Generalized weakness Skin: Skin is warm and dry. No rash noted. Psychiatric: Normal mood and affect.      LABORATORY PANEL:   CBC Recent Labs  Lab 11/27/17 0353  WBC 5.6  HGB 7.4*  HCT 22.2*  PLT 134*   ------------------------------------------------------------------------------------------------------------------  Chemistries  Recent Labs  Lab 11/27/17 0353  NA 139  K 5.4*  CL 117*  CO2 17*  GLUCOSE 87  BUN 52*  CREATININE 4.84*  CALCIUM 7.7*   ------------------------------------------------------------------------------------------------------------------  Cardiac Enzymes Recent Labs  Lab 11/24/17 2029  TROPONINI 0.04*   ------------------------------------------------------------------------------------------------------------------  RADIOLOGY:  US Renal  Result Date: 11/26/2017 CLINICAL DATA:  Acute renal failure. EXAM: RENAL / URINARY TRACT ULTRASOUND COMPLETE COMPARISON:  CT abdomen and pelvis 03/16/2014 FINDINGS: Right Kidney: Length: 9.8 cm. Mildly increased parenchymal echogenicity. No hydronephrosis. Multiple cysts, the largest measuring 2.3 cm. Left Kidney: Length: 10.5 cm. Mildly increased parenchymal echogenicity. No hydronephrosis. Multiple cysts, the largest measuring 4.3 cm. Bladder: Appears normal for degree of bladder distention. IMPRESSION: 1. Evidence of medical renal disease.  No hydronephrosis. 2. Bilateral renal cysts. Electronically Signed   By: Logan Bores M.D.   On: 11/26/2017 11:53     ASSESSMENT AND PLAN:   84 year old female with chronic kidney disease stage IV, PAF on anticoagulation who presented after a  fall and found to have subarachnoid hemorrhage.  1.  Acute on chronic kidney disease stage IV: Function  improving with IV fluids Nephrology follow-up appreciated Hold nephrotoxic medications  2.  Subarachnoid hemorrhage with acute blood loss anemia on chronic anemia: Patient evaluated by neurosurgery. CT scan x3 have been stable Neurosurgery recommending holding aspirin and Eliquis for 10 days. No surgical intervention needed at this time. Follow-up with neurosurgery in 2 to 3 weeks Status post PRBC transfusion IV Follow-up CBC  3.  PAF: Continue metoprolol for heart rate control Holding Eliquis and aspirin  4. Uncontrolled hypertension: Continue Norvasc 10 mg daily and increase to 25 mg orally twice a day metoprolol  5.  CAD: Continue pravastatin and metoprolol  6.  Depression: Continue Celexa  7.  Hypothyroidism: Continue Synthroid   PT and palliative care consultation requested  Management plans discussed with the patient and she is in agreement.  CODE STATUS: FULL  TOTAL TIME TAKING CARE OF THIS PATIENT: 34 minutes.     POSSIBLE D/C 1-2 days, DEPENDING ON CLINICAL CONDITION. Home with home health services   Saundra Shelling M.D on 11/27/2017 at 3:08 PM  Between 7am to 6pm - Pager - [phone] After 6pm go to [email or web address] - password [email or web address] [facility]  Office  [phone]  CC: Primary care physician; Valera Castle, MD  Note: This dictation was prepared with Dragon dictation along with smaller phrase technology. Any transcriptional errors that result from this process are unintentional.

## 2017-11-27 NOTE — Progress Notes (Signed)
Rapid called.  PT with HTN 224/115 HR 120- 130,  c/o shortness of breath, emesis.  Spoke with Dr Jannifer Franklin and Labetolol ordered, given, repeat BP in 1 hour.  If no improvement call him back. Dorna Bloom RN [phone]: Repeat BP 161/127. Dorna Bloom RN

## 2017-11-27 NOTE — Progress Notes (Signed)
   11/27/17 2031  Clinical Encounter Type  Visited With Patient not available  Visit Type Initial   Rapid Response Code..  Multiple staff members were providing patient care. No family present.  Provided pastoral presence and silent prayer from hallway.

## 2017-11-27 NOTE — Plan of Care (Signed)
Pt continues to have elevated BP despite BP meds and after 5 mg metoprolol IV given PRN.  Texted Dr. Estanislado Pandy - He started her on hydralazine 25 mg bid and 10 mg hydralazine q4 PRN if SBP > 160.  We d/ced the 5 mg metoprolol.  Pt had bout of tachycardia when she was wking with PT and also vomited.

## 2017-11-27 NOTE — Evaluation (Signed)
Physical Therapy Evaluation Patient Details Name: Kathryn Cain MRN: [id number] DOB: 1933/09/14 Today's Date: 11/27/2017   History of Present Illness  Kathryn Cain is a 84yo woman who comes [facility] on 7/[facility] after mechanical fall at home, facila trauma, noted to have SAH, and also AKI. PMH: GIB, duodenitis, anemia, severe HOH. Pt lives with son who works 3rd shift. Pt was independent in ADL PTA.  Pt was evaluated by Pryor Curia 4MA while admitted adn AMB 232ft with RW at household AMB speed, increased HR to 120s and BP 202/135mmHg. Marland Kitchen   Clinical Impression  Pt admitted with above diagnosis. Pt currently with functional limitations due to the deficits listed below (see "PT Problem List"). Upon entry, pt in bed, no family/caregiver present. Pt is severely HOH and able to answer some questions, but much background information is limited by communication limitations. The pt is awake and agreeable to participate. Pt mobilizes to EOB with minA from PT, verbalizing surprise of how weak she is. At EOB pt soon becomes nauseated and is given an emesis back. She continues to have emesis at EOB about 1x q 45seconds for 5-6 minutes while PT attempts to make contact with nursing. Eventually, patient is returned to semi recumbent position in bed and nursing enters room. Pt surprised verbalizes question "why am I getting sick to my stomach?": RN reports no prior emesis this date. Functional mobility assessment demonstrates increased effort/time requirements, poor tolerance, and need for physical assistance, whereas the patient performed these at a higher level of independence PTA. Of note: pt has had high BP this morning, most recently established as 178/95 mmHg immediately prior to entry. Pt also remains anemic with Hb:7.4, HCT: 22.2. Empirically, the patient demonstrates increased risk of recurrent falls AEB gait speed <0.24m/s, forward reach <5", and multiple LOB demonstrated throughout session. Pt will benefit from  skilled PT intervention to increase independence and safety with basic mobility in preparation for discharge to the venue listed below.       Follow Up Recommendations Supervision/Assistance - 24 hour;SNF    Equipment Recommendations  None recommended by PT    Recommendations for Other Services       Precautions / Restrictions Precautions Precautions: Fall Precaution Comments: New SAH Restrictions Weight Bearing Restrictions: No      Mobility  Bed Mobility Overal bed mobility: Needs Assistance Bed Mobility: Supine to Sit     Supine to sit: Min assist     General bed mobility comments: able to flex trunk and rotate independently with max effort, but unable to scoot butt to EOB without assistance.   Transfers Overall transfer level: (Pt soon becomes nauseated adn with emesis while at EOB unable to attempt xfers)                  Ambulation/Gait                Stairs            Wheelchair Mobility    Modified Rankin (Stroke Patients Only)       Balance                                             Pertinent Vitals/Pain Pain Assessment: No/denies pain    Home Living Family/patient expects to be discharged to:: Private residence Living Arrangements: Children Available Help at Discharge: Family Type of Home: House  Home Access: Stairs to enter   Entrance Stairs-Number of Steps: 1 Home Layout: One level Home Equipment: [place] - 2 wheels;Bedside commode      Prior Function Level of Independence: Independent with assistive device(s)         Comments: RW for household and limited community distances slowly.      Hand Dominance        Extremity/Trunk Assessment                Communication   Communication: HOH  Cognition Arousal/Alertness: Awake/alert Behavior During Therapy: WFL for tasks assessed/performed Overall Cognitive Status: Difficult to assess                                         General Comments      Exercises     Assessment/Plan    PT Assessment Patient needs continued PT services  PT Problem List Decreased strength;Decreased activity tolerance;Decreased mobility       PT Treatment Interventions Gait training;Therapeutic exercise;Therapeutic activities;Functional mobility training;Patient/family education;Balance training    PT Goals (Current goals can be found in the Care Plan section)  Acute Rehab PT Goals PT Goal Formulation: Patient unable to participate in goal setting    Frequency Min 2X/week   Barriers to discharge Decreased caregiver support      Co-evaluation               AM-PAC PT "6 Clicks" Daily Activity  Outcome Measure Difficulty turning over in bed (including adjusting bedclothes, sheets and blankets)?: A Lot Difficulty moving from lying on back to sitting on the side of the bed? : A Lot Difficulty sitting down on and standing up from a chair with arms (e.g., wheelchair, bedside commode, etc,.)?: Unable Help needed moving to and from a bed to chair (including a wheelchair)?: A Little Help needed walking in hospital room?: A Little Help needed climbing 3-5 steps with a railing? : Total 6 Click Score: 12    End of Session   Activity Tolerance: Treatment limited secondary to medical complications (Comment)(emesis at EOB) Patient left: in bed;with nursing/sitter in room;with call bell/phone within reach Nurse Communication: Other (comment) PT Visit Diagnosis: Difficulty in walking, not elsewhere classified (R26.2);Muscle weakness (generalized) (M62.81);History of falling (Z91.81)    Time: 9935-7017 PT Time Calculation (min) (ACUTE ONLY): 17 min   Charges:   PT Evaluation $PT Eval High Complexity: 1 High     12:17 PM, 11/27/17 Kathryn Cain, PT, DPT Physical Therapist - [facility]  [phone] ([place])    Primus Gritton C 11/27/2017, 12:11 PM

## 2017-11-27 NOTE — Progress Notes (Signed)
BP elevated 177/102. Pt complains of head ache.  Metoprolol 5 mg IVP given. Dorna Bloom RN

## 2017-11-27 NOTE — Progress Notes (Signed)
[facility], [place] 11/27/17  Subjective:   Doing better S Creatinine is lower but still critically high Potassium slightly higher  Objective:  Vital signs in last 24 hours:  Temp:  [98.6 F (37 C)-99 F (37.2 C)] 99 F (37.2 C) (07/27 0544) Pulse Rate:  [74-91] 89 (07/27 0948) Resp:  [18-22] 19 (07/27 0544) BP: (156-180)/(79-102) 178/95 (07/27 0948) SpO2:  [94 %-100 %] 96 % (07/27 0544) Weight:  [61.8 kg (136 lb 3.9 oz)] 61.8 kg (136 lb 3.9 oz) (07/27 0544)  Weight change: 3.876 kg (8 lb 8.7 oz) Filed Weights   11/25/17 0556 11/26/17 0436 11/27/17 0544  Weight: 57.8 kg (127 lb 8 oz) 57.9 kg (127 lb 11.2 oz) 61.8 kg (136 lb 3.9 oz)    Intake/Output:    Intake/Output Summary (Last 24 hours) at 11/27/2017 1051 Last data filed at 11/27/2017 0600 Gross per 24 hour  Intake 3654.42 ml  Output -  Net 3654.42 ml   Physical Exam: General:  NAD laying in bed  HEENT Multiple bruises on face, decreased hearing  Neck:  supple  Lungs: Clear to auscultation  Heart::  2/6 systolic murmur, irregular  Abdomen: Soft, NT  Extremities:  Trace edema  Neurologic: Alert, able to follow simple commands  Skin: No acute rashes     Basic Metabolic Panel:  Recent Labs  Lab 11/24/17 2029 11/26/17 0814 11/27/17 0353  NA 137 138 139  K 5.3* 5.3* 5.4*  CL 109 114* 117*  CO2 18* 18* 17*  GLUCOSE 130* 92 87  BUN 64* 56* 52*  CREATININE 5.82* 5.31* 4.84*  CALCIUM 8.8* 7.9* 7.7*  PHOS  --   --  4.5     CBC: Recent Labs  Lab 11/24/17 2029 11/26/17 0814 11/27/17 0353  WBC 6.5 4.9 5.6  HGB 7.6* 6.2* 7.4*  HCT 22.5* 18.8* 22.2*  MCV 95.5 96.8 92.9  PLT 156 131* 134*     No results found for: HEPBSAG, HEPBSAB, HEPBIGM    Microbiology:  No results found for this or any previous visit (from the past 240 hour(s)).  Coagulation Studies: No results for input(s): LABPROT, INR in the last 72 hours.  Urinalysis: Recent Labs    11/26/17 0430   COLORURINE YELLOW*  LABSPEC 1.012  PHURINE 6.0  GLUCOSEU NEGATIVE  HGBUR NEGATIVE  BILIRUBINUR NEGATIVE  KETONESUR NEGATIVE  PROTEINUR 100*  NITRITE NEGATIVE  LEUKOCYTESUR NEGATIVE      Imaging: US Renal  Result Date: 11/26/2017 CLINICAL DATA:  Acute renal failure. EXAM: RENAL / URINARY TRACT ULTRASOUND COMPLETE COMPARISON:  CT abdomen and pelvis 03/16/2014 FINDINGS: Right Kidney: Length: 9.8 cm. Mildly increased parenchymal echogenicity. No hydronephrosis. Multiple cysts, the largest measuring 2.3 cm. Left Kidney: Length: 10.5 cm. Mildly increased parenchymal echogenicity. No hydronephrosis. Multiple cysts, the largest measuring 4.3 cm. Bladder: Appears normal for degree of bladder distention. IMPRESSION: 1. Evidence of medical renal disease.  No hydronephrosis. 2. Bilateral renal cysts. Electronically Signed   By: Logan Bores M.D.   On: 11/26/2017 11:53     Medications:   . sodium chloride 125 mL/hr at 11/27/17 0948   . amLODipine  10 mg Oral Daily  . citalopram  20 mg Oral Daily  . docusate sodium  100 mg Oral BID  . levothyroxine  25 mcg Oral QAC breakfast  . metoprolol tartrate  25 mg Oral Daily  . pantoprazole  40 mg Oral Daily  . pravastatin  80 mg Oral QHS   acetaminophen **OR** acetaminophen, diphenhydrAMINE **  OR** acetaminophen, furosemide, hydrOXYzine, metoprolol tartrate, ondansetron **OR** ondansetron (ZOFRAN) IV  Assessment/ Plan:  84 y.o.african american female with A Fib, COPD, HTN, CKD, was admitted on 11/24/2017 with fall and found to have sub arachnoid hemorrhage.   1. ARF on CKD st 4 Patient appears to have advanced CKD. Was not followed by nephrology  Renal u/s shoes bilateral cysts;  Continue iv hydration   S Creatinine has improved some; will continue to monitor  2. Mild hyperkalemia SPS given today Monitor     Will follow      LOS: 2 Micky Sheller Candiss Norse 7/27/201910:51 AM  [place],  [place]  Note: This note was prepared with Dragon dictation. Any transcription errors are unintentional

## 2017-11-28 LAB — CBC
HEMATOCRIT: 23.8 % — AB (ref 35.0–47.0)
HEMOGLOBIN: 8 g/dL — AB (ref 12.0–16.0)
MCH: 31.5 pg (ref 26.0–34.0)
MCHC: 33.8 g/dL (ref 32.0–36.0)
MCV: 93 fL (ref 80.0–100.0)
Platelets: 132 10*3/uL — ABNORMAL LOW (ref 150–440)
RBC: 2.56 MIL/uL — ABNORMAL LOW (ref 3.80–5.20)
RDW: 15.1 % — ABNORMAL HIGH (ref 11.5–14.5)
WBC: 7 10*3/uL (ref 3.6–11.0)

## 2017-11-28 LAB — BASIC METABOLIC PANEL
ANION GAP: 7 (ref 5–15)
BUN: 47 mg/dL — ABNORMAL HIGH (ref 8–23)
CALCIUM: 8.1 mg/dL — AB (ref 8.9–10.3)
CHLORIDE: 118 mmol/L — AB (ref 98–111)
CO2: 16 mmol/L — AB (ref 22–32)
Creatinine, Ser: 4.71 mg/dL — ABNORMAL HIGH (ref 0.44–1.00)
GFR calc non Af Amer: 8 mL/min — ABNORMAL LOW (ref >60)
GFR, EST AFRICAN AMERICAN: 9 mL/min — AB (ref >60)
Glucose, Bld: 115 mg/dL — ABNORMAL HIGH (ref 70–99)
POTASSIUM: 4.9 mmol/L (ref 3.5–5.1)
Sodium: 141 mmol/L (ref 135–145)

## 2017-11-28 MED ORDER — METOPROLOL TARTRATE 25 MG PO TABS: 25.0000 mg | ORAL_TABLET | Freq: Two times a day (BID) | ORAL | 0 refills | Status: AC

## 2017-11-28 MED ORDER — HYDRALAZINE HCL 25 MG PO TABS: 25.0000 mg | ORAL_TABLET | Freq: Three times a day (TID) | ORAL | 0 refills | Status: AC

## 2017-11-28 MED ORDER — AMLODIPINE BESYLATE 5 MG PO TABS: 10.0000 mg | ORAL_TABLET | Freq: Every day | ORAL | 0 refills | Status: AC

## 2017-11-28 NOTE — Care Management Note (Signed)
Case Management Note  Patient Details  Name: Kathryn Cain MRN: [id number] Date of Birth: [birth year]  Subjective/Objective:    Patient to be discharged per MD order. Orders in place for home health services. Patient in agreement but prefer I speak with her son since she will be staying with him. Per son [name] he would like to use [facility]. Referral placed with [facility] who accepts patient for PT,RN and aide services. No DME needs. Son to provide transport.  Ines Bloomer RN BSN RNCM [phone]                 Action/Plan:   Expected Discharge Date:  11/28/17               Expected Discharge Plan:     In-House Referral:     Discharge planning Services  CM Consult  Post Acute Care Choice:  Home Health Choice offered to:  Patient, Adult Children  DME Arranged:    DME Agency:     HH Arranged:  RN, PT, Nurse's Aide [place] Agency:  [place]  Status of Service:  Completed, signed off  If discussed at [place] of Stay Meetings, dates discussed:    Additional Comments:  Kathyrn Drown Romilda Proby, RN 11/28/2017, 10:22 AM

## 2017-11-28 NOTE — Plan of Care (Signed)
  Problem: Clinical Measurements: Goal: Will remain free from infection Outcome: Progressing Goal: Respiratory complications will improve Outcome: Progressing   Problem: Nutrition: Goal: Adequate nutrition will be maintained Outcome: Progressing   Problem: Coping: Goal: Level of anxiety will decrease Outcome: Progressing   Problem: Elimination: Goal: Will not experience complications related to bowel motility Outcome: Progressing   Problem: Pain Managment: Goal: General experience of comfort will improve Outcome: Progressing   Problem: Safety: Goal: Ability to remain free from injury will improve Outcome: Progressing   Problem: Skin Integrity: Goal: Risk for impaired skin integrity will decrease Outcome: Progressing   

## 2017-11-28 NOTE — Discharge Summary (Signed)
[place] [facility] [place] NAME: Kathryn Cain    MR#:  [id number]  DATE OF BIRTH:  [birth year]  DATE OF ADMISSION:  11/24/2017 ADMITTING PHYSICIAN: Harrie Foreman, MD  DATE OF DISCHARGE: 11/28/2017 11:30 AM  PRIMARY CARE PHYSICIAN: Valera Castle, MD   ADMISSION DIAGNOSIS:  Subarachnoid hemorrhage ([place]) [I60.9] Acute kidney injury ([place]) [N17.9] Closed fracture of nasal bone, initial encounter [S02.2XXA]  Acute on chronic kidney disease stage IV Coronary artery disease  Hypertension Chronic atrial fibrillation  DISCHARGE DIAGNOSIS:  Acute on chronic kidney disease stage IV Subarachnoid hemorrhage Chronic atrial fibrillation Hypertension Coronary artery disease Closed fracture of nasal bone  SECONDARY DIAGNOSIS:   Past Medical History:  Diagnosis Date  . A-fib ([place])   . Anginal pain ([place])   . Arthritis   . Cancer (HCC)    BREAST  . Chronic renal insufficiency   . COPD (chronic obstructive pulmonary disease) ([place])   . Coronary artery disease   . Depression   . Diverticulosis   . Gout   . HOH (hard of hearing)   . Hyperlipidemia   . Hypertension   . Myocardial infarction ([place])   . Nephrolithiasis   . Peripheral vascular disease ([place])   . Stroke [facility])    2013     ADMITTING HISTORY  The patient with past medical history of atrial fibrillation, breast cancer, coronary artery disease, hypertension and COPD presents to the emergency department after a mechanical fall.  The patient suffered multiple traumas to her head and face.  CT of her head showed a small subarachnoid hemorrhage.  Neurosurgery was contacted and advised following up 6 hours later to show extension or stability of bleed.  The patient also had acute on chronic renal insufficiency which prompted the emergency department staff, hospitalist service for admission.   HOSPITAL COURSE:  Patient was admitted to medical floor.  She was evaluated by neurosurgery  who recommended to hold the aspirin and Eliquis.  Neurosurgery saw the patient and  advised repeat CT head no acute surgical intervention recommended.  Advised to hold all anticoagulation.  Patient sustained a closed fracture of the nasal bones secondary to fall. patient's Eliquis was held patient received IV fluids for her renal insufficiency.  Her kidney functions improved with IV fluids.  A repeat CT head imaging study did not show any increase in size of subarachnoid hemorrhage.  Patient received physical therapy in the hospital.  Her ecchymosis and bruises over the face also improved which she sustained secondary to fall.  Patient hemodynamically stable will be discharged home hold Eliquis and aspirin for another 10 days and follow-up with primary care physician prior to resuming them.  Patient was worked up with renal ultrasound CT cervical spine, CT head, CT maxillofacial area during the stay in the hospital.  Renal ultrasound shows bilateral renal cysts.  X-ray of the shoulder showed no fracture but rotator cuff injury outpatient follow-up . She also had imaging studies of the elbow and shoulder with x-rays.  CONSULTS OBTAINED:  Treatment Team:  Deetta Perla, MD Murlean Iba, MD Saundra Shelling, MD  DRUG ALLERGIES:   Allergies  Allergen Reactions  . Amiodarone Itching  . Ciprofloxacin Hcl Other (See Comments)    Unknown reaction  . Codeine Itching  . Eliquis [Apixaban]     DISCHARGE MEDICATIONS:   Allergies as of 11/28/2017      Reactions   Amiodarone Itching   Ciprofloxacin Hcl Other (See Comments)   Unknown  reaction   Codeine Itching   Eliquis [apixaban]       Medication List    STOP taking these medications   allopurinol 100 MG tablet Commonly known as:  ZYLOPRIM   aspirin 81 MG tablet   ELIQUIS 5 MG Tabs tablet Generic drug:  apixaban   furosemide 40 MG tablet Commonly known as:  LASIX     TAKE these medications   amLODipine 5 MG tablet Commonly known as:   NORVASC Take 2 tablets (10 mg total) by mouth daily. What changed:  how much to take   citalopram 20 MG tablet Commonly known as:  CELEXA Take 20 mg by mouth daily.   diphenhydramine-acetaminophen 25-500 MG Tabs tablet Commonly known as:  TYLENOL PM Take 1 tablet by mouth at bedtime as needed (for sleep).   DUREZOL 0.05 % Emul Generic drug:  Difluprednate Apply 1 drop to eye 4 (four) times daily. Begin after surgery (07/08/2017)   FISH OIL PO Take 1 capsule by mouth daily.   GERITOL PO Take 1 tablet by mouth daily.   hydrALAZINE 25 MG tablet Commonly known as:  APRESOLINE Take 1 tablet (25 mg total) by mouth 3 (three) times daily.   hydrOXYzine 25 MG tablet Commonly known as:  ATARAX/VISTARIL TAKE 0.5 TABLETS (12.5 MG TOTAL) BY MOUTH 3 (THREE) TIMES DAILY AS NEEDED FOR ITCHING   levothyroxine 25 MCG tablet Commonly known as:  SYNTHROID, LEVOTHROID PLEASE SEE ATTACHED FOR DETAILED DIRECTIONS   metoprolol tartrate 25 MG tablet Commonly known as:  LOPRESSOR Take 1 tablet (25 mg total) by mouth 2 (two) times daily. What changed:  when to take this   pantoprazole 40 MG tablet Commonly known as:  PROTONIX Take 1 tablet (40 mg total) by mouth daily.   pravastatin 80 MG tablet Commonly known as:  PRAVACHOL Take 80 mg by mouth at bedtime.   PROAIR HFA 108 (90 Base) MCG/ACT inhaler Generic drug:  albuterol Inhale 2 puffs into the lungs every 6 (six) hours as needed for shortness of breath or wheezing.   traMADol 50 MG tablet Commonly known as:  ULTRAM Take by mouth every 8 (eight) hours as needed.   traZODone 50 MG tablet Commonly known as:  DESYREL Take 50 mg by mouth at bedtime.       Today  Patient seen and evaluated today  ecchymosis and bruises over the face improved  blood pressure better controlled Patient will be discharged home with home health services  VITAL SIGNS:  Blood pressure (!) 141/78, pulse 85, temperature 98.6 F (37 C), temperature source  Oral, resp. rate 18, height 5\' 3"  (1.6 m), weight 64.6 kg (142 lb 6.4 oz), SpO2 100 %.  I/O:    Intake/Output Summary (Last 24 hours) at 11/28/2017 1341 Last data filed at 11/28/2017 1028 Gross per 24 hour  Intake 5775 ml  Output -  Net 5775 ml    PHYSICAL EXAMINATION:  Physical Exam  GENERAL:  84 y.o.-year-old patient lying in the bed with no acute distress.  LUNGS: Normal breath sounds bilaterally, no wheezing, rales,rhonchi or crepitation. No use of accessory muscles of respiration.  CARDIOVASCULAR: S1, S2 normal. No murmurs, rubs, or gallops.  ABDOMEN: Soft, non-tender, non-distended. Bowel sounds present. No organomegaly or mass.  NEUROLOGIC: Moves all 4 extremities. PSYCHIATRIC: The patient is alert and oriented x 3.  SKIN: ecchymosis, bruises over face improved  DATA REVIEW:   CBC Recent Labs  Lab 11/28/17 0408  WBC 7.0  HGB 8.0*  HCT 23.8*  PLT 132*    Chemistries  Recent Labs  Lab 11/28/17 0408  NA 141  K 4.9  CL 118*  CO2 16*  GLUCOSE 115*  BUN 47*  CREATININE 4.71*  CALCIUM 8.1*    Cardiac Enzymes Recent Labs  Lab 11/24/17 2029  TROPONINI 0.04*    Microbiology Results  Results for orders placed or performed during the hospital encounter of 03/28/16  Aerobic/Anaerobic Culture (surgical/deep wound)     Status: None   Collection Time: 03/28/16  5:12 PM  Result Value Ref Range Status   Specimen Description FINGER  Final   Special Requests NONE  Final   Gram Stain   Final    ABUNDANT WBC PRESENT, PREDOMINANTLY PMN RARE GRAM NEGATIVE RODS RARE GRAM POSITIVE COCCI IN PAIRS IN CLUSTERS Performed at [facility]    Culture   Final    NO GROWTH AEROBICALLY IN 3 DAYS MIXED ANAEROBIC FLORA PRESENT.  CALL LAB IF FURTHER IID REQUIRED.    Report Status 04/03/2016 FINAL  Final    RADIOLOGY:  No results found.  Follow up with PCP in 1 week.  Management plans discussed with the patient, family and they are in agreement.  CODE STATUS:  Full code    Code Status Orders  (From admission, onward)        Start     Ordered   11/25/17 0535  Full code  Continuous     11/25/17 0535    Code Status History    Date Active Date Inactive Code Status Order ID Comments User Context   07/15/2017 1224 07/17/2017 1556 Full Code [id number]  Saundra Shelling, MD Inpatient      TOTAL TIME TAKING CARE OF THIS PATIENT ON DAY OF DISCHARGE: more than 35 minutes.   Saundra Shelling M.D on 11/28/2017 at 1:41 PM  Between 7am to 6pm - Pager - [phone]  After 6pm go to [email or web address] - password [email or web address] [place] [facility]  Office  [phone]  CC: Primary care physician; Valera Castle, MD  Note: This dictation was prepared with Dragon dictation along with smaller phrase technology. Any transcriptional errors that result from this process are unintentional.

## 2017-11-28 NOTE — Progress Notes (Signed)
Advanced care plan. Purpose of the Encounter: CODE STATUS Parties in Attendance:Patient Patient's Decision Capacity:Good Subjective/Patient's story: Presented for fall Objective/Medical story Has sub arachnoid hemorrhage and nasal bone fracture Goals of care determination: Advance care directives and goals of care discussed during hospitalization Patient wants everything done for now which includes cpr and intubation if need arises CODE STATUS: Full code Time spent discussing advanced care planning: 16 minutes

## 2017-11-28 NOTE — Progress Notes (Signed)
Discharge instructions given and went over with patients son at bedside. Medications reviewed. Education provided. Follow-up appointments reviewed. Son verbalized understanding of all discharge instructions. Patient discharged home with home health. Patient transported to car in wheelchair by nursing staff. Madlyn Frankel, RN

## 2017-11-28 NOTE — Progress Notes (Signed)
Son made nursing aware that patients shoes are missing. Shoes are not present in room nor documented in the belongings on admission. Son given number for patient belongings by NS, Edwin Dada. Madlyn Frankel, RN

## 2017-11-28 NOTE — Progress Notes (Signed)
[facility], [place] 11/28/17  Subjective:   Doing better S Creatinine is lower but still critically high Her son and daughter-in-law They state that patient is overall doing better Educated about kidney disease  Objective:  Vital signs in last 24 hours:  Temp:  [98 F (36.7 C)-98.6 F (37 C)] 98.6 F (37 C) (07/28 0520) Pulse Rate:  [82-127] 85 (07/28 0829) Resp:  [16-20] 18 (07/28 0523) BP: (137-224)/(64-177) 141/78 (07/28 0829) SpO2:  [98 %-100 %] 100 % (07/28 0523) Weight:  [64.6 kg (142 lb 6.4 oz)] 64.6 kg (142 lb 6.4 oz) (07/28 0308)  Weight change: 2.792 kg (6 lb 2.5 oz) Filed Weights   11/26/17 0436 11/27/17 0544 11/28/17 0308  Weight: 57.9 kg (127 lb 11.2 oz) 61.8 kg (136 lb 3.9 oz) 64.6 kg (142 lb 6.4 oz)    Intake/Output:    Intake/Output Summary (Last 24 hours) at 11/28/2017 1124 Last data filed at 11/28/2017 1028 Gross per 24 hour  Intake 5775 ml  Output -  Net 5775 ml   Physical Exam: General:  NAD sitting up in chair  HEENT Multiple bruises on face, improving, decreased hearing  Neck:  supple  Lungs: Clear to auscultation  Heart::  2/6 systolic murmur, irregular  Abdomen: Soft, NT  Extremities:  Trace edema  Neurologic: Alert, able to follow simple commands  Skin: No acute rashes     Basic Metabolic Panel:  Recent Labs  Lab 11/24/17 2029 11/26/17 0814 11/27/17 0353 11/28/17 0408  NA 137 138 139 141  K 5.3* 5.3* 5.4* 4.9  CL 109 114* 117* 118*  CO2 18* 18* 17* 16*  GLUCOSE 130* 92 87 115*  BUN 64* 56* 52* 47*  CREATININE 5.82* 5.31* 4.84* 4.71*  CALCIUM 8.8* 7.9* 7.7* 8.1*  PHOS  --   --  4.5  --      CBC: Recent Labs  Lab 11/24/17 2029 11/26/17 0814 11/27/17 0353 11/28/17 0408  WBC 6.5 4.9 5.6 7.0  HGB 7.6* 6.2* 7.4* 8.0*  HCT 22.5* 18.8* 22.2* 23.8*  MCV 95.5 96.8 92.9 93.0  PLT 156 131* 134* 132*     No results found for: HEPBSAG, HEPBSAB, HEPBIGM    Microbiology:  No results found  for this or any previous visit (from the past 240 hour(s)).  Coagulation Studies: No results for input(s): LABPROT, INR in the last 72 hours.  Urinalysis: Recent Labs    11/26/17 0430  COLORURINE YELLOW*  LABSPEC 1.012  PHURINE 6.0  GLUCOSEU NEGATIVE  HGBUR NEGATIVE  BILIRUBINUR NEGATIVE  KETONESUR NEGATIVE  PROTEINUR 100*  NITRITE NEGATIVE  LEUKOCYTESUR NEGATIVE      Imaging: US Renal  Result Date: 11/26/2017 CLINICAL DATA:  Acute renal failure. EXAM: RENAL / URINARY TRACT ULTRASOUND COMPLETE COMPARISON:  CT abdomen and pelvis 03/16/2014 FINDINGS: Right Kidney: Length: 9.8 cm. Mildly increased parenchymal echogenicity. No hydronephrosis. Multiple cysts, the largest measuring 2.3 cm. Left Kidney: Length: 10.5 cm. Mildly increased parenchymal echogenicity. No hydronephrosis. Multiple cysts, the largest measuring 4.3 cm. Bladder: Appears normal for degree of bladder distention. IMPRESSION: 1. Evidence of medical renal disease.  No hydronephrosis. 2. Bilateral renal cysts. Electronically Signed   By: Logan Bores M.D.   On: 11/26/2017 11:53     Medications:   . sodium chloride Stopped (11/28/17 1029)   . amLODipine  10 mg Oral Daily  . citalopram  20 mg Oral Daily  . docusate sodium  100 mg Oral BID  . hydrALAZINE  25 mg Oral  BID  . levothyroxine  25 mcg Oral QAC breakfast  . metoprolol tartrate  25 mg Oral BID  . pantoprazole  40 mg Oral Daily  . pravastatin  80 mg Oral QHS   acetaminophen **OR** acetaminophen, diphenhydrAMINE **OR** acetaminophen, cloNIDine, furosemide, hydrALAZINE, hydrOXYzine, labetalol, ondansetron **OR** ondansetron (ZOFRAN) IV  Assessment/ Plan:  84 y.o.african american female with A Fib, COPD, HTN, CKD, was admitted on 11/24/2017 with fall and found to have sub arachnoid hemorrhage.   1. ARF on CKD st 4 Patient appears to have advanced CKD. Was not followed by nephrology  Renal u/s shows bilateral cysts;  S Creatinine has improved some; will  continue to monitor Follow-up as outpatient  2. Mild hyperkalemia Improved Monitor  3.  Mild acidosis Likely due to chronic kidney disease We will start sodium bicarbonate tablets as outpatient  4.  Anemia Further work-up as outpatient     Will follow      LOS: 3 Kathryn Cain 7/28/201911:24 AM  [place], [place]  Note: This note was prepared with Dragon dictation. Any transcription errors are unintentional

## 2017-11-29 LAB — PARATHYROID HORMONE, INTACT (NO CA): PTH: 136 pg/mL — AB (ref 15–65)

## 2017-12-09 ENCOUNTER — Other Ambulatory Visit: Payer: Self-pay

## 2017-12-09 ENCOUNTER — Emergency Department: Payer: Medicare HMO

## 2017-12-09 ENCOUNTER — Observation Stay
Admission: EM | Admit: 2017-12-09 | Discharge: 2017-12-10 | Disposition: A | Discharge status: Home / Self Care | Payer: Medicare HMO | Attending: Internal Medicine | Admitting: Internal Medicine

## 2017-12-09 ENCOUNTER — Encounter: Payer: Self-pay | Admitting: Emergency Medicine

## 2017-12-09 DIAGNOSIS — Y95 Nosocomial condition: Secondary | ICD-10-CM | POA: Insufficient documentation

## 2017-12-09 DIAGNOSIS — E785 Hyperlipidemia, unspecified: Secondary | ICD-10-CM | POA: Diagnosis not present

## 2017-12-09 DIAGNOSIS — Z853 Personal history of malignant neoplasm of breast: Secondary | ICD-10-CM | POA: Diagnosis not present

## 2017-12-09 DIAGNOSIS — Z8673 Personal history of transient ischemic attack (TIA), and cerebral infarction without residual deficits: Secondary | ICD-10-CM | POA: Insufficient documentation

## 2017-12-09 DIAGNOSIS — I4891 Unspecified atrial fibrillation: Secondary | ICD-10-CM | POA: Insufficient documentation

## 2017-12-09 DIAGNOSIS — I2510 Atherosclerotic heart disease of native coronary artery without angina pectoris: Secondary | ICD-10-CM | POA: Diagnosis not present

## 2017-12-09 DIAGNOSIS — I700 Atherosclerosis of aorta: Secondary | ICD-10-CM | POA: Diagnosis not present

## 2017-12-09 DIAGNOSIS — I739 Peripheral vascular disease, unspecified: Secondary | ICD-10-CM | POA: Diagnosis not present

## 2017-12-09 DIAGNOSIS — J90 Pleural effusion, not elsewhere classified: Secondary | ICD-10-CM | POA: Insufficient documentation

## 2017-12-09 DIAGNOSIS — Z881 Allergy status to other antibiotic agents status: Secondary | ICD-10-CM | POA: Insufficient documentation

## 2017-12-09 DIAGNOSIS — I129 Hypertensive chronic kidney disease with stage 1 through stage 4 chronic kidney disease, or unspecified chronic kidney disease: Secondary | ICD-10-CM | POA: Diagnosis not present

## 2017-12-09 DIAGNOSIS — R0602 Shortness of breath: Secondary | ICD-10-CM

## 2017-12-09 DIAGNOSIS — Z885 Allergy status to narcotic agent status: Secondary | ICD-10-CM | POA: Insufficient documentation

## 2017-12-09 DIAGNOSIS — Z7901 Long term (current) use of anticoagulants: Secondary | ICD-10-CM | POA: Diagnosis not present

## 2017-12-09 DIAGNOSIS — I252 Old myocardial infarction: Secondary | ICD-10-CM | POA: Insufficient documentation

## 2017-12-09 DIAGNOSIS — K219 Gastro-esophageal reflux disease without esophagitis: Secondary | ICD-10-CM | POA: Diagnosis not present

## 2017-12-09 DIAGNOSIS — Z79899 Other long term (current) drug therapy: Secondary | ICD-10-CM | POA: Diagnosis not present

## 2017-12-09 DIAGNOSIS — J440 Chronic obstructive pulmonary disease with acute lower respiratory infection: Principal | ICD-10-CM | POA: Insufficient documentation

## 2017-12-09 DIAGNOSIS — N189 Chronic kidney disease, unspecified: Secondary | ICD-10-CM | POA: Diagnosis not present

## 2017-12-09 DIAGNOSIS — F17210 Nicotine dependence, cigarettes, uncomplicated: Secondary | ICD-10-CM | POA: Insufficient documentation

## 2017-12-09 DIAGNOSIS — J189 Pneumonia, unspecified organism: Secondary | ICD-10-CM | POA: Insufficient documentation

## 2017-12-09 DIAGNOSIS — N179 Acute kidney failure, unspecified: Secondary | ICD-10-CM | POA: Diagnosis not present

## 2017-12-09 DIAGNOSIS — E039 Hypothyroidism, unspecified: Secondary | ICD-10-CM | POA: Insufficient documentation

## 2017-12-09 DIAGNOSIS — Z89021 Acquired absence of right finger(s): Secondary | ICD-10-CM | POA: Insufficient documentation

## 2017-12-09 DIAGNOSIS — Z888 Allergy status to other drugs, medicaments and biological substances status: Secondary | ICD-10-CM | POA: Insufficient documentation

## 2017-12-09 DIAGNOSIS — F329 Major depressive disorder, single episode, unspecified: Secondary | ICD-10-CM | POA: Diagnosis not present

## 2017-12-09 LAB — BASIC METABOLIC PANEL
Anion gap: 11 (ref 5–15)
BUN: 65 mg/dL — AB (ref 8–23)
CALCIUM: 9.2 mg/dL (ref 8.9–10.3)
CHLORIDE: 110 mmol/L (ref 98–111)
CO2: 15 mmol/L — ABNORMAL LOW (ref 22–32)
CREATININE: 5.83 mg/dL — AB (ref 0.44–1.00)
GFR, EST AFRICAN AMERICAN: 7 mL/min — AB (ref >60)
GFR, EST NON AFRICAN AMERICAN: 6 mL/min — AB (ref >60)
Glucose, Bld: 114 mg/dL — ABNORMAL HIGH (ref 70–99)
Potassium: 5.3 mmol/L — ABNORMAL HIGH (ref 3.5–5.1)
SODIUM: 136 mmol/L (ref 135–145)

## 2017-12-09 LAB — CBC WITH DIFFERENTIAL/PLATELET
BASOS PCT: 1 %
Basophils Absolute: 0 10*3/uL (ref 0–0.1)
EOS ABS: 0 10*3/uL (ref 0–0.7)
EOS PCT: 1 %
HCT: 23 % — ABNORMAL LOW (ref 35.0–47.0)
HEMOGLOBIN: 7.7 g/dL — AB (ref 12.0–16.0)
Lymphocytes Relative: 9 %
Lymphs Abs: 0.6 10*3/uL — ABNORMAL LOW (ref 1.0–3.6)
MCH: 31.4 pg (ref 26.0–34.0)
MCHC: 33.6 g/dL (ref 32.0–36.0)
MCV: 93.5 fL (ref 80.0–100.0)
MONOS PCT: 8 %
Monocytes Absolute: 0.5 10*3/uL (ref 0.2–0.9)
NEUTROS PCT: 81 %
Neutro Abs: 5.4 10*3/uL (ref 1.4–6.5)
PLATELETS: 197 10*3/uL (ref 150–440)
RBC: 2.46 MIL/uL — ABNORMAL LOW (ref 3.80–5.20)
RDW: 15.4 % — ABNORMAL HIGH (ref 11.5–14.5)
WBC: 6.5 10*3/uL (ref 3.6–11.0)

## 2017-12-09 MED ORDER — SODIUM CHLORIDE 0.9 % IV SOLN
INTRAVENOUS | Status: DC
  Administered 2017-12-10: via INTRAVENOUS

## 2017-12-09 MED ORDER — DOCUSATE SODIUM 100 MG PO CAPS
100.0000 mg | ORAL_CAPSULE | Freq: Two times a day (BID) | ORAL | Status: DC
  Administered 2017-12-10: 100 mg via ORAL
  Filled 2017-12-09: qty 1

## 2017-12-09 MED ORDER — DIFLUPREDNATE 0.05 % OP EMUL: 1.0000 [drp] | Freq: Four times a day (QID) | OPHTHALMIC | Status: DC

## 2017-12-09 MED ORDER — SODIUM CHLORIDE 0.9 % IV SOLN
2.0000 g | Freq: Once | INTRAVENOUS | Status: AC
  Administered 2017-12-09: 2 g via INTRAVENOUS
  Filled 2017-12-09: qty 2

## 2017-12-09 MED ORDER — ONDANSETRON HCL 4 MG PO TABS: 4.0000 mg | ORAL_TABLET | Freq: Four times a day (QID) | ORAL | Status: DC | PRN

## 2017-12-09 MED ORDER — AMLODIPINE BESYLATE 10 MG PO TABS
10.0000 mg | ORAL_TABLET | Freq: Every day | ORAL | Status: DC
  Administered 2017-12-10: 10 mg via ORAL
  Filled 2017-12-09: qty 1

## 2017-12-09 MED ORDER — ONDANSETRON HCL 4 MG/2ML IJ SOLN
4.0000 mg | Freq: Four times a day (QID) | INTRAMUSCULAR | Status: DC | PRN
  Administered 2017-12-10: 4 mg via INTRAVENOUS
  Filled 2017-12-09: qty 2

## 2017-12-09 MED ORDER — HYDRALAZINE HCL 50 MG PO TABS
25.0000 mg | ORAL_TABLET | Freq: Three times a day (TID) | ORAL | Status: DC
  Administered 2017-12-10: 10:00:00 25 mg via ORAL
  Filled 2017-12-09 (×2): qty 1

## 2017-12-09 MED ORDER — LEVOTHYROXINE SODIUM 25 MCG PO TABS
25.0000 ug | ORAL_TABLET | Freq: Every day | ORAL | Status: DC
  Administered 2017-12-10: 10:00:00 25 ug via ORAL
  Filled 2017-12-09: qty 1

## 2017-12-09 MED ORDER — SODIUM POLYSTYRENE SULFONATE 15 GM/60ML PO SUSP
30.0000 g | ORAL | Status: AC
  Administered 2017-12-09: 30 g via ORAL
  Filled 2017-12-09: qty 120

## 2017-12-09 MED ORDER — TRAZODONE HCL 50 MG PO TABS
50.0000 mg | ORAL_TABLET | Freq: Every day | ORAL | Status: DC
  Filled 2017-12-09: qty 1

## 2017-12-09 MED ORDER — PRAVASTATIN SODIUM 20 MG PO TABS
80.0000 mg | ORAL_TABLET | Freq: Every day | ORAL | Status: DC
  Filled 2017-12-09: qty 4

## 2017-12-09 MED ORDER — VANCOMYCIN HCL IN DEXTROSE 1-5 GM/200ML-% IV SOLN
1000.0000 mg | Freq: Once | INTRAVENOUS | Status: AC
  Administered 2017-12-10: 01:00:00 1000 mg via INTRAVENOUS
  Filled 2017-12-09: qty 200

## 2017-12-09 MED ORDER — METOPROLOL TARTRATE 25 MG PO TABS
25.0000 mg | ORAL_TABLET | Freq: Two times a day (BID) | ORAL | Status: DC
  Administered 2017-12-10: 10:00:00 25 mg via ORAL
  Filled 2017-12-09 (×2): qty 1

## 2017-12-09 MED ORDER — CITALOPRAM HYDROBROMIDE 20 MG PO TABS
20.0000 mg | ORAL_TABLET | Freq: Every day | ORAL | Status: DC
  Administered 2017-12-10: 20 mg via ORAL
  Filled 2017-12-09: qty 1

## 2017-12-09 MED ORDER — ALBUTEROL SULFATE (2.5 MG/3ML) 0.083% IN NEBU: 2.5000 mg | INHALATION_SOLUTION | RESPIRATORY_TRACT | Status: DC | PRN

## 2017-12-09 MED ORDER — PANTOPRAZOLE SODIUM 40 MG PO TBEC
40.0000 mg | DELAYED_RELEASE_TABLET | Freq: Every day | ORAL | Status: DC
  Administered 2017-12-10: 40 mg via ORAL
  Filled 2017-12-09: qty 1

## 2017-12-09 MED ORDER — APIXABAN 5 MG PO TABS
5.0000 mg | ORAL_TABLET | Freq: Two times a day (BID) | ORAL | Status: DC
  Administered 2017-12-10 (×2): 5 mg via ORAL
  Filled 2017-12-09 (×2): qty 1

## 2017-12-09 MED ORDER — ACETAMINOPHEN 650 MG RE SUPP: 650.0000 mg | Freq: Four times a day (QID) | RECTAL | Status: DC | PRN

## 2017-12-09 MED ORDER — ACETAMINOPHEN 325 MG PO TABS
650.0000 mg | ORAL_TABLET | Freq: Four times a day (QID) | ORAL | Status: DC | PRN
Start: 2017-12-09 — End: 2017-12-10

## 2017-12-09 MED ORDER — SODIUM CHLORIDE 0.9 % IV SOLN
1.0000 g | INTRAVENOUS | Status: DC
  Filled 2017-12-09: qty 1

## 2017-12-09 NOTE — ED Triage Notes (Signed)
Pt sent in by family for "gagging". Pt made hospice a week ago after hemorrhagic stroke with acute kidney injury. Family has been unable to follow up with hospice per ems so sent in today for eval. Pt in no distress. Denies pain. Is periodically spitting in bag.

## 2017-12-09 NOTE — ED Provider Notes (Signed)
[facility] Emergency Department Provider Note  ____________________________________________   I have reviewed the triage vital signs and the nursing notes.   HISTORY  Chief Complaint Shortness of Breath   History limited by: Not Limited   HPI Kathryn Cain is a 84 y.o. female who presents to the emergency department today was sent in by family today because of concerns for gagging, shortness of breath and thoughts that we might be able to help arrange hospice care.  Patient is currently seeking hospice care because of concerns for kidney failure.  Patient is not interested in dialysis.  She states she has a history of acid reflux and she thinks that is what is been causing the gagging and shortness of breath.   Per medical record review patient has been trying to get set up with hospice care  Past Medical History:  Diagnosis Date  . A-fib ([place])   . Anginal pain ([place])   . Arthritis   . Cancer (HCC)    BREAST  . Chronic renal insufficiency   . COPD (chronic obstructive pulmonary disease) ([place])   . Coronary artery disease   . Depression   . Diverticulosis   . Gout   . HOH (hard of hearing)   . Hyperlipidemia   . Hypertension   . Myocardial infarction ([place])   . Nephrolithiasis   . Peripheral vascular disease ([place])   . Stroke [facility])    2013    Patient Active Problem List   Diagnosis Date Noted  . Acute kidney injury superimposed on chronic kidney disease ([place]) 11/25/2017  . Benign neoplasm of descending colon   . Gastritis without bleeding   . Duodenitis   . GI bleed 07/15/2017  . Iron deficiency anemia     Past Surgical History:  Procedure Laterality Date  . AMPUTATION Right 04/02/2016   Procedure: AMPUTATION FINGER TIP;  Surgeon: Hessie Knows, MD;  Location: [facility] ORS;  Service: Orthopedics;  Laterality: Right;  . APPENDECTOMY    . BREAST SURGERY Right   . CARDIAC SURGERY    . CATARACT EXTRACTION W/PHACO Right 07/08/2017   Procedure:  CATARACT EXTRACTION PHACO AND INTRAOCULAR LENS PLACEMENT (IOC);  Surgeon: Eulogio Bear, MD;  Location: [facility] ORS;  Service: Ophthalmology;  Laterality: Right;  fluid lot #[id number]  exp 02/01/2019 [place]   00:32.2 AP%   12.7 CDE  4.08   . COLONOSCOPY WITH PROPOFOL N/A 07/16/2017   Procedure: COLONOSCOPY WITH PROPOFOL;  Surgeon: [name], MD;  Location: [facility] ENDOSCOPY;  Service: Endoscopy;  Laterality: N/A;  . CORONARY ARTERY BYPASS GRAFT     2013  . DISTAL INTERPHALANGEAL JOINT FUSION Right 11/28/2015   Procedure: DISTAL INTERPHALANGEAL JOINT FUSION;  Surgeon: Hessie Knows, MD;  Location: [facility] ORS;  Service: Orthopedics;  Laterality: Right;  third finger  . ESOPHAGOGASTRODUODENOSCOPY (EGD) WITH PROPOFOL N/A 07/16/2017   Procedure: ESOPHAGOGASTRODUODENOSCOPY (EGD) WITH PROPOFOL;  Surgeon: [name], MD;  Location: [facility] ENDOSCOPY;  Service: Endoscopy;  Laterality: N/A;  . KIDNEY STONE SURGERY    . MASTECTOMY Right    1986    Prior to Admission medications   Medication Sig Start Date End Date Taking? Authorizing Provider  amLODipine (NORVASC) 5 MG tablet Take 2 tablets (10 mg total) by mouth daily. 11/28/17 12/28/17  Saundra Shelling, MD  citalopram (CELEXA) 20 MG tablet Take 20 mg by mouth daily.    [provider]  diphenhydramine-acetaminophen (TYLENOL PM) 25-500 MG TABS tablet Take 1 tablet by mouth at bedtime as needed (for  sleep).     [provider]  DUREZOL 0.05 % EMUL Apply 1 drop to eye 4 (four) times daily. Begin after surgery (07/08/2017)    Eulogio Bear, MD  hydrALAZINE (APRESOLINE) 25 MG tablet Take 1 tablet (25 mg total) by mouth 3 (three) times daily. 11/28/17 12/28/17  Saundra Shelling, MD  hydrOXYzine (ATARAX/VISTARIL) 25 MG tablet TAKE 0.5 TABLETS (12.5 MG TOTAL) BY MOUTH 3 (THREE) TIMES DAILY AS NEEDED FOR ITCHING 10/19/17   [provider]  Iron-Vitamins (GERITOL PO) Take 1 tablet by mouth daily.     [provider]  levothyroxine  (SYNTHROID, LEVOTHROID) 25 MCG tablet PLEASE SEE ATTACHED FOR DETAILED DIRECTIONS 10/05/17   [provider]  metoprolol tartrate (LOPRESSOR) 25 MG tablet Take 1 tablet (25 mg total) by mouth 2 (two) times daily. 11/28/17 12/28/17  Saundra Shelling, MD  Omega-3 Fatty Acids (FISH OIL PO) Take 1 capsule by mouth daily.    [provider]  pantoprazole (PROTONIX) 40 MG tablet Take 1 tablet (40 mg total) by mouth daily. 07/17/17   Fritzi Mandes, MD  pravastatin (PRAVACHOL) 80 MG tablet Take 80 mg by mouth at bedtime.     [provider]  PROAIR HFA 108 [phone] Base) MCG/ACT inhaler Inhale 2 puffs into the lungs every 6 (six) hours as needed for shortness of breath or wheezing. 05/28/17   [provider]  traMADol (ULTRAM) 50 MG tablet Take by mouth every 8 (eight) hours as needed.    [provider]  traZODone (DESYREL) 50 MG tablet Take 50 mg by mouth at bedtime.    [provider]    Allergies Amiodarone; Ciprofloxacin hcl; Codeine; and Eliquis [apixaban]  History reviewed. No pertinent family history.  Social History Social History   Tobacco Use  . Smoking status: Current Some Day Smoker    Packs/day: 0.50    Types: Cigarettes  . Smokeless tobacco: Never Used  Substance Use Topics  . Alcohol use: No  . Drug use: No    Review of Systems Constitutional: No fever/chills Eyes: No visual changes. ENT: No sore throat. Cardiovascular: Denies chest pain. Respiratory: Positive for shortness of breath. Gastrointestinal: Positive for epigastric pain and gagging Genitourinary: Negative for dysuria. Musculoskeletal: Negative for back pain. Skin: Negative for rash. Neurological: Negative for headaches, focal weakness or numbness.  ____________________________________________   PHYSICAL EXAM:  VITAL SIGNS: ED Triage Vitals [12/09/17 1657]  Enc Vitals Group     BP (!) 144/81     Pulse Rate (!) 107     Resp 18     Temp 98.2 F (36.8 C)      Temp Source Oral     SpO2 95 %     Weight 142 lb 6.7 oz (64.6 kg)     Height 5\' 3"  (1.6 m)     Head Circumference      Peak Flow      Pain Score 0   Constitutional: Alert and oriented.  Eyes: Conjunctivae are normal.  ENT      Head: Normocephalic and atraumatic.      Nose: No congestion/rhinnorhea.      Mouth/Throat: Mucous membranes are moist.      Neck: No stridor. Hematological/Lymphatic/Immunilogical: No cervical lymphadenopathy. Cardiovascular: Normal rate, regular rhythm.  No murmurs, rubs, or gallops.  Respiratory: Normal respiratory effort without tachypnea nor retractions. Breath sounds are clear and equal bilaterally. No wheezes/rales/rhonchi. Gastrointestinal: Soft and non tender. No rebound. No guarding.  Genitourinary: Deferred Musculoskeletal: Normal range of  motion in all extremities. No lower extremity edema. Neurologic:  Normal speech and language. No gross focal neurologic deficits are appreciated.  Skin:  Skin is warm, dry and intact. No rash noted. Psychiatric: Mood and affect are normal. Speech and behavior are normal. Patient exhibits appropriate insight and judgment.  ____________________________________________    LABS (pertinent positives/negatives)  BMP na 136, k 5.3, cr 5.83 CBC wbc 6.5, hgb 7.7, plt 197 ____________________________________________   EKG  None  ____________________________________________    RADIOLOGY  CXR Finding concerning for pneumonia vs aspiration   ____________________________________________   PROCEDURES  Procedures  ____________________________________________   INITIAL IMPRESSION / ASSESSMENT AND PLAN / ED COURSE  Pertinent labs & imaging results that were available during my care of the patient were reviewed by me and considered in my medical decision making (see chart for details).   Patient presented to the emergency department because of concerns for gagging episode and some shortness of breath as  well as trying to arrange hospice.  Patient has known history of end-stage renal disease however declines dialysis.  Blood work here continues to show elevated creatinine and anemia.  X-ray however does show possible pneumonia.  Given this finding will plan on antibiotics admission.  Discussed findings plan with patient and family.   ____________________________________________   FINAL CLINICAL IMPRESSION(S) / ED DIAGNOSES  Final diagnoses:  Shortness of breath  HCAP (healthcare-associated pneumonia)     Note: This dictation was prepared with Dragon dictation. Any transcriptional errors that result from this process are unintentional     Nance Pear, MD 12/09/17 2349

## 2017-12-09 NOTE — ED Notes (Signed)
Spoke with [facility] of [place] [facility] [phone]. They will call me back

## 2017-12-09 NOTE — ED Notes (Signed)
Social work is gone for the day

## 2017-12-09 NOTE — Progress Notes (Signed)
Pharmacy Antibiotic Note  Kathryn Cain is a 84 y.o. female admitted on 12/09/2017 with pneumonia.  Pharmacy has been consulted for vancomycin and cefepime dosing.  Plan: DW 65 kg   Vd 46L kei 0.01 hr-1  T1/2 69 hours Vancomycin 1 gram x1 given in ED. Will dose based on serial levels due to patients diminished renal function and no HD.  Cefepime 1 gram q 24 hours ordered.  Height: 5\' 3"  (160 cm) Weight: 142 lb 6.7 oz (64.6 kg) IBW/kg (Calculated) : 52.4  Temp (24hrs), Avg:98.2 F (36.8 C), Min:98.2 F (36.8 C), Max:98.2 F (36.8 C)  Recent Labs  Lab 12/09/17 1912  WBC 6.5  CREATININE 5.83*    Estimated Creatinine Clearance: 6.8 mL/min (A) (by C-G formula based on SCr of 5.83 mg/dL (H)).    Allergies  Allergen Reactions  . Amiodarone Itching  . Ciprofloxacin Hcl Other (See Comments)    Unknown reaction  . Codeine Itching  . Eliquis [Apixaban]     Antimicrobials this admission: Vancomycin, cefepime 8/8  >>    >>   Dose adjustments this admission:   Microbiology results: 8/8 BCx: pending 8/8 MRSA PCR: pending      8/8 CXR: L base consolidation Thank you for allowing pharmacy to be a part of this patient's care.  Sim Boast, PharmD, BCPS  12/09/17 10:17 PM

## 2017-12-10 ENCOUNTER — Other Ambulatory Visit: Payer: Self-pay

## 2017-12-10 LAB — CBC
HCT: 21.6 % — ABNORMAL LOW (ref 35.0–47.0)
HEMOGLOBIN: 7.1 g/dL — AB (ref 12.0–16.0)
MCH: 31 pg (ref 26.0–34.0)
MCHC: 32.8 g/dL (ref 32.0–36.0)
MCV: 94.6 fL (ref 80.0–100.0)
Platelets: 176 10*3/uL (ref 150–440)
RBC: 2.28 MIL/uL — AB (ref 3.80–5.20)
RDW: 15.4 % — ABNORMAL HIGH (ref 11.5–14.5)
WBC: 5.7 10*3/uL (ref 3.6–11.0)

## 2017-12-10 LAB — MRSA PCR SCREENING: MRSA by PCR: NEGATIVE

## 2017-12-10 LAB — BASIC METABOLIC PANEL
ANION GAP: 10 (ref 5–15)
BUN: 66 mg/dL — ABNORMAL HIGH (ref 8–23)
CHLORIDE: 111 mmol/L (ref 98–111)
CO2: 17 mmol/L — AB (ref 22–32)
Calcium: 8.3 mg/dL — ABNORMAL LOW (ref 8.9–10.3)
Creatinine, Ser: 5.61 mg/dL — ABNORMAL HIGH (ref 0.44–1.00)
GFR calc non Af Amer: 6 mL/min — ABNORMAL LOW (ref >60)
GFR, EST AFRICAN AMERICAN: 7 mL/min — AB (ref >60)
GLUCOSE: 100 mg/dL — AB (ref 70–99)
Potassium: 4.7 mmol/L (ref 3.5–5.1)
Sodium: 138 mmol/L (ref 135–145)

## 2017-12-10 LAB — PROCALCITONIN: Procalcitonin: 0.54 ng/mL

## 2017-12-10 MED ORDER — LEVOFLOXACIN 500 MG PO TABS: 500.0000 mg | ORAL_TABLET | ORAL | 0 refills | Status: AC

## 2017-12-10 MED ORDER — APIXABAN 2.5 MG PO TABS: 2.5000 mg | ORAL_TABLET | Freq: Two times a day (BID) | ORAL | Status: DC

## 2017-12-10 MED ORDER — VANCOMYCIN HCL IN DEXTROSE 1-5 GM/200ML-% IV SOLN: 1000.0000 mg | INTRAVENOUS | Status: DC | PRN

## 2017-12-10 NOTE — Discharge Summary (Signed)
Kathryn Cain at [place] NAME: Kathryn Cain    MR#:  [id number]  DATE OF BIRTH:  08-04-1933  DATE OF ADMISSION:  12/09/2017   ADMITTING PHYSICIAN: Harrie Foreman, MD  DATE OF DISCHARGE: 12/10/17  PRIMARY CARE PHYSICIAN: Valera Castle, MD   ADMISSION DIAGNOSIS:  Shortness of breath [R06.02] HCAP (healthcare-associated pneumonia) [J18.9] DISCHARGE DIAGNOSIS:  Active Problems:   Acute-on-chronic kidney injury ([place])  SECONDARY DIAGNOSIS:   Past Medical History:  Diagnosis Date  . A-fib ([place])   . Anginal pain ([place])   . Arthritis   . Cancer (HCC)    BREAST  . Chronic renal insufficiency   . COPD (chronic obstructive pulmonary disease) ([place])   . Coronary artery disease   . Depression   . Diverticulosis   . Gout   . HOH (hard of hearing)   . Hyperlipidemia   . Hypertension   . Myocardial infarction ([place])   . Nephrolithiasis   . Peripheral vascular disease ([place])   . Stroke [facility])    2013   HOSPITAL COURSE:   [name] is an 84 year old female with a PMH of COPD, atrial fibrillation (not on anticoagulation due to recent subarachnoid hemorrhage), COPD, CAD w/ hx of MI, breast cancer, HTN, and HLD who presented to the ED with cough and shortness of breath. CXR showed pneumonia. Labs also showed an AKI, so she was admitted for further management.  Acute renal failure in CKD VI- likely due to dehydration in the setting of HCAP - Cr improved from 5.83 to 5.61 with hydration - patient has consistently said that she would not want dialysis - discharged home with home hospice - avoid nephrotoxic agents  HCAP- no hypoxia, tachypnea, or hemodynamic instability this admission - CXR showed possible left lower lobe pneumonia - PCT was 0.54 - Initially treated with Vancomycin and Cefepime, prescribed Levaquin every other day for a total of 5 days  DISCHARGE CONDITIONS:  CKD VI HCAP, improving Atrial fibrillation (not on  anticoagulation due to recent subarachnoid hemorrhage) COPD CAD with hx of MI Hx breast cancer HTN HLD CONSULTS OBTAINED:  none DRUG ALLERGIES:   Allergies  Allergen Reactions  . Amiodarone Itching  . Ciprofloxacin Hcl Other (See Comments)    Unknown reaction  . Codeine Itching  . Eliquis [Apixaban]    DISCHARGE MEDICATIONS:   Allergies as of 12/10/2017      Reactions   Amiodarone Itching   Ciprofloxacin Hcl Other (See Comments)   Unknown reaction   Codeine Itching   Eliquis [apixaban]       Medication List    TAKE these medications   amLODipine 5 MG tablet Commonly known as:  NORVASC Take 2 tablets (10 mg total) by mouth daily.   citalopram 20 MG tablet Commonly known as:  CELEXA Take 20 mg by mouth daily.   diphenhydramine-acetaminophen 25-500 MG Tabs tablet Commonly known as:  TYLENOL PM Take 1 tablet by mouth at bedtime as needed (for sleep).   Fish Oil 1000 MG Caps Take 1,000 mg by mouth daily.   GERITOL PO Take 1 tablet by mouth daily.   hydrALAZINE 25 MG tablet Commonly known as:  APRESOLINE Take 1 tablet (25 mg total) by mouth 3 (three) times daily.   hydrOXYzine 25 MG tablet Commonly known as:  ATARAX/VISTARIL TAKE 0.5 TABLETS (12.5 MG TOTAL) BY MOUTH 3 (THREE) TIMES DAILY AS NEEDED FOR ITCHING   levofloxacin 500 MG tablet Commonly known as:  LEVAQUIN Take 1 tablet (500 mg total) by mouth every other day for 4 days.   levothyroxine 25 MCG tablet Commonly known as:  SYNTHROID, LEVOTHROID Take 25 mcg by mouth daily.   metoprolol tartrate 25 MG tablet Commonly known as:  LOPRESSOR Take 1 tablet (25 mg total) by mouth 2 (two) times daily.   pantoprazole 40 MG tablet Commonly known as:  PROTONIX Take 1 tablet (40 mg total) by mouth daily.   pravastatin 80 MG tablet Commonly known as:  PRAVACHOL Take 80 mg by mouth at bedtime.   PROAIR HFA 108 (90 Base) MCG/ACT inhaler Generic drug:  albuterol Inhale 2 puffs into the lungs every 6 (six)  hours as needed for shortness of breath or wheezing.   traMADol 50 MG tablet Commonly known as:  ULTRAM Take by mouth every 8 (eight) hours as needed.   traZODone 50 MG tablet Commonly known as:  DESYREL Take 50 mg by mouth at bedtime.        DISCHARGE INSTRUCTIONS:  1. F/u with PCP in 1-2 weeks 2. Diagnosed with HCAP- treated with cefepime and vancomycin, then transitioned to levaquin every other day for a total of 5 days 3. Kidney function has worsened, but patient does not want to receive dialysis 4. Discharged home with hospice DIET:  Regular diet DISCHARGE CONDITION:  Stable ACTIVITY:  Activity as tolerated OXYGEN:  Home Oxygen: Yes.    Oxygen Delivery: room air DISCHARGE LOCATION:  home   If you experience worsening of your admission symptoms, develop shortness of breath, life threatening emergency, suicidal or homicidal thoughts you must seek medical attention immediately by calling 911 or calling your MD immediately  if symptoms less severe.  You Must read complete instructions/literature along with all the possible adverse reactions/side effects for all the Medicines you take and that have been prescribed to you. Take any new Medicines after you have completely understood and accpet all the possible adverse reactions/side effects.   Please note  You were cared for by a hospitalist during your hospital stay. If you have any questions about your discharge medications or the care you received while you were in the hospital after you are discharged, you can call the unit and asked to speak with the hospitalist on call if the hospitalist that took care of you is not available. Once you are discharged, your primary care physician will handle any further medical issues. Please note that NO REFILLS for any discharge medications will be authorized once you are discharged, as it is imperative that you return to your primary care physician (or establish a relationship with a  primary care physician if you do not have one) for your aftercare needs so that they can reassess your need for medications and monitor your lab values.    On the day of Discharge:  VITAL SIGNS:  Blood pressure 121/70, pulse 91, temperature 98.3 F (36.8 C), temperature source Oral, resp. rate 18, height 5\' 3"  (1.6 m), weight 60.3 kg, SpO2 95 %. PHYSICAL EXAMINATION:  GENERAL:  84 y.o.-year-old patient lying in the bed with no acute distress, pleasant EYES: Pupils equal, round, reactive to light and accommodation. No scleral icterus. Extraocular muscles intact.  HEENT: Head atraumatic, normocephalic. Oropharynx and nasopharynx clear.  NECK:  Supple, no jugular venous distention. No thyroid enlargement, no tenderness.  LUNGS: Normal breath sounds bilaterally, no wheezing, rales, rhonchi or crepitation. No use of accessory muscles of respiration.  CARDIOVASCULAR: S1, S2 normal. No murmurs, rubs, or gallops.  ABDOMEN:  Soft, non-tender, non-distended. Bowel sounds present. No organomegaly or mass.  EXTREMITIES: No pedal edema, cyanosis, or clubbing.  NEUROLOGIC: Cranial nerves II through XII are intact. Muscle strength 5/5 in all extremities. Sensation intact. Gait not checked.  PSYCHIATRIC: The patient is alert and oriented x 3.  SKIN: No obvious rash, lesion, or ulcer.  DATA REVIEW:   CBC Recent Labs  Lab 12/10/17 1101  WBC 5.7  HGB 7.1*  HCT 21.6*  PLT 176    Chemistries  Recent Labs  Lab 12/10/17 1101  NA 138  K 4.7  CL 111  CO2 17*  GLUCOSE 100*  BUN 66*  CREATININE 5.61*  CALCIUM 8.3*     Microbiology Results  Results for orders placed or performed during the hospital encounter of 12/09/17  Blood culture (routine x 2)     Status: None (Preliminary result)   Collection Time: 12/09/17  8:58 PM  Result Value Ref Range Status   Specimen Description BLOOD LEFT ASSIST CONTROL  Final   Special Requests   Final    BOTTLES DRAWN AEROBIC AND ANAEROBIC Blood Culture  results may not be optimal due to an excessive volume of blood received in culture bottles   Culture   Final    NO GROWTH < 12 HOURS Performed at [facility], [address], [place], [place] [address]    Report Status PENDING  Incomplete  Blood culture (routine x 2)     Status: None (Preliminary result)   Collection Time: 12/09/17  9:38 PM  Result Value Ref Range Status   Specimen Description BLOOD LEFT ASSIST CONTROL  Final   Special Requests   Final    BOTTLES DRAWN AEROBIC AND ANAEROBIC Blood Culture adequate volume   Culture   Final    NO GROWTH < 12 HOURS Performed at [facility], [address] [place], [place] [address]    Report Status PENDING  Incomplete  MRSA PCR Screening     Status: None   Collection Time: 12/10/17  1:20 AM  Result Value Ref Range Status   MRSA by PCR NEGATIVE NEGATIVE Final    Comment:        The GeneXpert MRSA Assay (FDA approved for NASAL specimens only), is one component of a comprehensive MRSA colonization surveillance program. It is not intended to diagnose MRSA infection nor to guide or monitor treatment for MRSA infections. Performed at [facility], [address], [place], [place] [address]     RADIOLOGY:  Dg Chest Portable 1 View  Result Date: 12/09/2017 CLINICAL DATA:  Shortness of breath.  Recent hemorrhagic CVA bilat EXAM: PORTABLE CHEST 1 VIEW COMPARISON:  October 12, 2016 FINDINGS: There is airspace consolidation in the left lower lobe with small left pleural effusion. Lungs elsewhere are clear. Heart is mildly enlarged with pulmonary vascularity normal. Patient is status post coronary artery bypass grafting. There is aortic atherosclerosis. There are surgical clips in the right axilla with evidence of previous right-sided mastectomy. IMPRESSION: Airspace consolidation consistent with either pneumonia or aspiration left base. Small left pleural effusion. Mild cardiomegaly. Aortic atherosclerosis.  Postoperative changes as noted. Aortic Atherosclerosis (ICD10-I70.0). Electronically Signed   By: Lowella Grip III M.D.   On: 12/09/2017 19:04     Management plans discussed with the patient, family and they are in agreement.  CODE STATUS: Full Code   TOTAL TIME TAKING CARE OF THIS PATIENT: 35 minutes.    Berna Spare Mayo M.D on 12/10/2017 at 3:30 PM  Between  7am to 6pm - Pager - [phone]  After 6pm go to [email or web address] - Proofreader  [facility]  Office  [phone]  CC: Primary care physician; Valera Castle, MD   Note: This dictation was prepared with Dragon dictation along with smaller phrase technology. Any transcriptional errors that result from this process are unintentional.

## 2017-12-10 NOTE — Care Management Note (Signed)
Case Management Note  Patient Details  Name: Kathryn Cain MRN: [id number] Date of Birth: 1934/01/21  Subjective/Objective:  Admitted to [facility] with the diagnosis of acute on chronic kidney failure. Lives with son, [name]. [name] (son) [phone]). Sees Dr, Kym Groom as primary care physician. [facility] [address] [facility] [address]. [facility] was arranged 11/28/17 when discharged from this facility.  No skilled facilities. Rolling walker in the home. Last fall was July 2019. Fair appetite. No weight loss or gain.  No dialysis at this time, Informed at son had called [facility] last week and they were suppose to visit next week.  Doreatha Lew RN representative of [facility] updated.                 Action/Plan: Will continue to follow for discharge plans   Expected Discharge Date:                  Expected Discharge Plan:     In-House Referral:     Discharge planning Services     Post Acute Care Choice:    Choice offered to:     DME Arranged:    DME Agency:     HH Arranged:    HH Agency:     Status of Service:     If discussed at [employer] of Stay Meetings, dates discussed:    Additional Comments:  Shelbie Ammons, RN MSN CCM Care Management [phone] 12/10/2017, 8:58 AM

## 2017-12-10 NOTE — Discharge Planning (Signed)
Patient's IV removed.  RN assessment and VS revealed stability for DC with Hospice.  Discharge papers given, explained and educated.  Informed of suggested FU appt and appt made.  [facility] [employer].  Once  Ready, will be wheeled to front and family transporting home via car.

## 2017-12-10 NOTE — Progress Notes (Signed)
New referral for hospice at home.  Patient is appropriate for hospice services for Chronic Kidney Disease IV.  She also has co-morbidies of 4 hospitalizations in 08-22-17, CAD, COPD and recent subarachoid hemorrhage.  She was admitted to [facility] on 8.8.45 with pneumonia.  Medical history includes:  A-fib, Breast cancer, depression, diverticulosis, Gout, very HOH, hyperlipidemia, HTN, MI, PVD and CVA in 23-Aug-2011.  She currently lives with her son Roseanne Reno, who is not at the bedside.  I called and left a message oh his cellular phone.  Patient has 4 sons.  Son, Doren Custard is at the bedside, but does not know history of patient.  I did discuss palliative medicine vs. Hospice with him.  Patient had pending palliative consult with our agency, but was re-admitted prior to being seen by practitioner.  DME in the home that Doren Custard is aware of is walker.  Will ask that hospice RN do assessment of DME needs at time of hospice assessment.  Referral information faxed to referral intake.  Patient plans discharge home today or tomorrow.    Dimas Aguas, RN

## 2017-12-10 NOTE — H&P (Signed)
Kathryn Cain is an 84 y.o. female.   Chief Complaint: Shortness of breath HPI: The patient with past medical history of chronic kidney disease, atrial fibrillation, breast cancer, coronary artery disease, hypertension, COPD and subarachnoid hemorrhage presents to the emergency department with shortness of breath.  The patient admits to cough with scant production of thick phlegm.  She also reports weakness.  He also reports nausea but no vomiting and abdominal pain.  Chest x-ray in the emergency department showed possible left lower lobe pneumonia.  Notably the patient does not have any hypoxemia.  Laboratory evaluation was also significant for acute on chronic kidney failure which prompted the emergency department staff to call hospitalist service for admission.  Past Medical History:  Diagnosis Date  . A-fib ([place])   . Anginal pain ([place])   . Arthritis   . Cancer (HCC)    BREAST  . Chronic renal insufficiency   . COPD (chronic obstructive pulmonary disease) ([place])   . Coronary artery disease   . Depression   . Diverticulosis   . Gout   . HOH (hard of hearing)   . Hyperlipidemia   . Hypertension   . Myocardial infarction ([place])   . Nephrolithiasis   . Peripheral vascular disease ([place])   . Stroke [facility])    2013    Past Surgical History:  Procedure Laterality Date  . AMPUTATION Right 04/02/2016   Procedure: AMPUTATION FINGER TIP;  Surgeon: Hessie Knows, MD;  Location: [facility] ORS;  Service: Orthopedics;  Laterality: Right;  . APPENDECTOMY    . BREAST SURGERY Right   . CARDIAC SURGERY    . CATARACT EXTRACTION W/PHACO Right 07/08/2017   Procedure: CATARACT EXTRACTION PHACO AND INTRAOCULAR LENS PLACEMENT (IOC);  Surgeon: Eulogio Bear, MD;  Location: [facility] ORS;  Service: Ophthalmology;  Laterality: Right;  fluid lot #[id number]  exp 02/01/2019 [place]   00:32.2 AP%   12.7 CDE  4.08   . COLONOSCOPY WITH PROPOFOL N/A 07/16/2017   Procedure: COLONOSCOPY WITH PROPOFOL;  Surgeon: [name],  MD;  Location: [facility] ENDOSCOPY;  Service: Endoscopy;  Laterality: N/A;  . CORONARY ARTERY BYPASS GRAFT     2013  . DISTAL INTERPHALANGEAL JOINT FUSION Right 11/28/2015   Procedure: DISTAL INTERPHALANGEAL JOINT FUSION;  Surgeon: Hessie Knows, MD;  Location: [facility] ORS;  Service: Orthopedics;  Laterality: Right;  third finger  . ESOPHAGOGASTRODUODENOSCOPY (EGD) WITH PROPOFOL N/A 07/16/2017   Procedure: ESOPHAGOGASTRODUODENOSCOPY (EGD) WITH PROPOFOL;  Surgeon: [name], MD;  Location: [facility] ENDOSCOPY;  Service: Endoscopy;  Laterality: N/A;  . KIDNEY STONE SURGERY    . MASTECTOMY Right    1986    History reviewed. No pertinent family history. Social History:  reports that she has been smoking cigarettes. She has been smoking about 0.50 packs per day. She has never used smokeless tobacco. She reports that she does not drink alcohol or use drugs.  Allergies:  Allergies  Allergen Reactions  . Amiodarone Itching  . Ciprofloxacin Hcl Other (See Comments)    Unknown reaction  . Codeine Itching  . Eliquis [Apixaban]     Medications Prior to Admission  Medication Sig Dispense Refill  . amLODipine (NORVASC) 5 MG tablet Take 2 tablets (10 mg total) by mouth daily. 60 tablet 0  . apixaban (ELIQUIS) 5 MG TABS tablet Take 5 mg by mouth 2 (two) times daily.    . citalopram (CELEXA) 20 MG tablet Take 20 mg by mouth daily.    . hydrALAZINE (APRESOLINE) 25 MG tablet Take 1 tablet (  25 mg total) by mouth 3 (three) times daily. 90 tablet 0  . levothyroxine (SYNTHROID, LEVOTHROID) 25 MCG tablet Take 25 mcg by mouth daily.   11  . metoprolol tartrate (LOPRESSOR) 25 MG tablet Take 1 tablet (25 mg total) by mouth 2 (two) times daily. 60 tablet 0  . diphenhydramine-acetaminophen (TYLENOL PM) 25-500 MG TABS tablet Take 1 tablet by mouth at bedtime as needed (for sleep).     . DUREZOL 0.05 % EMUL Apply 1 drop to eye 4 (four) times daily. Begin after surgery (07/08/2017)  0  . hydrOXYzine (ATARAX/VISTARIL) 25 MG  tablet TAKE 0.5 TABLETS (12.5 MG TOTAL) BY MOUTH 3 (THREE) TIMES DAILY AS NEEDED FOR ITCHING  0  . Iron-Vitamins (GERITOL PO) Take 1 tablet by mouth daily.     . Omega-3 Fatty Acids (FISH OIL PO) Take 1 capsule by mouth daily.    . pantoprazole (PROTONIX) 40 MG tablet Take 1 tablet (40 mg total) by mouth daily. 30 tablet 2  . pravastatin (PRAVACHOL) 80 MG tablet Take 80 mg by mouth at bedtime.     Marland Kitchen PROAIR HFA 108 (90 Base) MCG/ACT inhaler Inhale 2 puffs into the lungs every 6 (six) hours as needed for shortness of breath or wheezing.  99  . traMADol (ULTRAM) 50 MG tablet Take by mouth every 8 (eight) hours as needed.    . traZODone (DESYREL) 50 MG tablet Take 50 mg by mouth at bedtime.      Results for orders placed or performed during the hospital encounter of 12/09/17 (from the past 48 hour(s))  CBC with Differential     Status: Abnormal   Collection Time: 12/09/17  7:12 PM  Result Value Ref Range   WBC 6.5 3.6 - 11.0 K/uL   RBC 2.46 (L) 3.80 - 5.20 MIL/uL   Hemoglobin 7.7 (L) 12.0 - 16.0 g/dL   HCT 23.0 (L) 35.0 - 47.0 %   MCV 93.5 80.0 - 100.0 fL   MCH 31.4 26.0 - 34.0 pg   MCHC 33.6 32.0 - 36.0 g/dL   RDW 15.4 (H) 11.5 - 14.5 %   Platelets 197 150 - 440 K/uL   Neutrophils Relative % 81 %   Neutro Abs 5.4 1.4 - 6.5 K/uL   Lymphocytes Relative 9 %   Lymphs Abs 0.6 (L) 1.0 - 3.6 K/uL   Monocytes Relative 8 %   Monocytes Absolute 0.5 0.2 - 0.9 K/uL   Eosinophils Relative 1 %   Eosinophils Absolute 0.0 0 - 0.7 K/uL   Basophils Relative 1 %   Basophils Absolute 0.0 0 - 0.1 K/uL    Comment: Performed at [facility], [address] [place], [place] [address]  Basic metabolic panel     Status: Abnormal   Collection Time: 12/09/17  7:12 PM  Result Value Ref Range   Sodium 136 135 - 145 mmol/L   Potassium 5.3 (H) 3.5 - 5.1 mmol/L   Chloride 110 98 - 111 mmol/L   CO2 15 (L) 22 - 32 mmol/L   Glucose, Bld 114 (H) 70 - 99 mg/dL   BUN 65 (H) 8 - 23 mg/dL   Creatinine, Ser  5.83 (H) 0.44 - 1.00 mg/dL   Calcium 9.2 8.9 - 10.3 mg/dL   GFR calc non Af Amer 6 (L) >60 mL/min   GFR calc Af Amer 7 (L) >60 mL/min    Comment: (NOTE) The eGFR has been calculated using the CKD EPI equation. This calculation has not been validated  in all clinical situations. eGFR's persistently <60 mL/min signify possible Chronic Kidney Disease.    Anion gap 11 5 - 15    Comment: Performed at [facility], [address] [place], [place] [address]   Dg Chest Portable 1 View  Result Date: 12/09/2017 CLINICAL DATA:  Shortness of breath.  Recent hemorrhagic CVA bilat EXAM: PORTABLE CHEST 1 VIEW COMPARISON:  October 12, 2016 FINDINGS: There is airspace consolidation in the left lower lobe with small left pleural effusion. Lungs elsewhere are clear. Heart is mildly enlarged with pulmonary vascularity normal. Patient is status post coronary artery bypass grafting. There is aortic atherosclerosis. There are surgical clips in the right axilla with evidence of previous right-sided mastectomy. IMPRESSION: Airspace consolidation consistent with either pneumonia or aspiration left base. Small left pleural effusion. Mild cardiomegaly. Aortic atherosclerosis. Postoperative changes as noted. Aortic Atherosclerosis (ICD10-I70.0). Electronically Signed   By: Lowella Grip III M.D.   On: 12/09/2017 19:04    Review of Systems  Constitutional: Negative for chills and fever.  HENT: Negative for sore throat and tinnitus.   Eyes: Negative for blurred vision and redness.  Respiratory: Positive for cough and sputum production. Negative for shortness of breath.   Cardiovascular: Negative for chest pain, palpitations, orthopnea and PND.  Gastrointestinal: Positive for abdominal pain and nausea. Negative for diarrhea and vomiting.  Genitourinary: Negative for dysuria, frequency and urgency.  Musculoskeletal: Negative for joint pain and myalgias.  Skin: Negative for rash.       No lesions  Neurological:  Negative for speech change, focal weakness and weakness.  Endo/Heme/Allergies: Does not bruise/bleed easily.       No temperature intolerance  Psychiatric/Behavioral: Negative for depression and suicidal ideas.    Blood pressure 132/87, pulse 100, temperature 99 F (37.2 C), temperature source Oral, resp. rate 19, height 5' 3"  (1.6 m), weight 60.3 kg, SpO2 95 %. Physical Exam  Vitals reviewed. Constitutional: She is oriented to person, place, and time. She appears well-developed and well-nourished. No distress.  HENT:  Head: Normocephalic and atraumatic.  Mouth/Throat: Oropharynx is clear and moist.  Eyes: Pupils are equal, round, and reactive to light. Conjunctivae and EOM are normal. No scleral icterus.  Neck: Normal range of motion. Neck supple. No JVD present. No tracheal deviation present. No thyromegaly present.  Cardiovascular: Normal rate, regular rhythm and normal heart sounds. Exam reveals no gallop and no friction rub.  No murmur heard. Respiratory: Effort normal and breath sounds normal.  GI: Soft. Bowel sounds are normal. She exhibits no distension. There is no tenderness.  Genitourinary:  Genitourinary Comments: Deferred  Musculoskeletal: Normal range of motion. She exhibits no edema.  Lymphadenopathy:    She has no cervical adenopathy.  Neurological: She is alert and oriented to person, place, and time. No cranial nerve deficit. She exhibits normal muscle tone.  Skin: Skin is warm and dry. No rash noted. No erythema.  Psychiatric: She has a normal mood and affect. Her behavior is normal. Judgment and thought content normal.     Assessment/Plan This is an 84 year old female admitted for acute on chronic kidney injury. 1.  CKD: With superimposed acute kidney injury.  GFR is below 10.  The patient does not want dialysis.  Her last discharge from the hospital included hospice care arrangements however discharge planning notes showed that there is significant barriers to  making her home appropriate for home care with specific end-of-life needs.  At this time the patient still makes urine and she does not  appear uremic.  Hydrate gently with intravenous fluid.  Nephrotoxic agents. 2.  Pneumonia: Healthcare associated.  No signs or symptoms of sepsis at this time.  Continue vancomycin and cefepime.  The patient has not had hypoxia nor is she tachypneic. 3.  Hypertension: Controlled; continue amlodipine, metoprolol and hydralazine 4.  Hypothyroidism: Continue Synthroid. 5.  History of TIA: Continue Eliquis.  (The patient was cleared to resume anticoagulation following her subarachnoid hemorrhage) 6.  Hyperlipidemia: Continue statin therapy 7.  Depression: Continue Celexa and trazodone 8.  DVT prophylaxis: Full dose anticoagulation 9.  GI prophylaxis: Pantoprazole per home regimen The patient is a full code.  Time spent on admission orders and patient care proximally 45 minutes  Harrie Foreman, MD 12/10/2017, 2:20 AM

## 2017-12-10 NOTE — Care Management (Signed)
30 Day Unplanned Readmission Risk Score     ED to Hosp-Admission (Discharged) from 11/24/2017 in [place] (1C)  30 Day Unplanned Readmission Risk Score (%)  25 Filed at 11/28/2017 1201     This score is the patient's risk of an unplanned readmission within 30 days of being discharged (0 -100%). The score is based on dignosis, age, lab data, medications, orders, and past utilization.   Low:  0-14.9   Medium: 15-21.9   High: 22-29.9   Extreme: 30 and above        Readmission Risk Prevention Plan 12/10/2017  Transportation Screening Complete  PCP or Specialist Appt within 3-5 Days Complete  Home Care Screening Complete  HRI or [place] Complete  Social Work Consult for [place] Planning/Counseling Complete  Palliative Care Screening Complete  Medication Review Press photographer) Complete  Some recent data might be hidden

## 2017-12-10 NOTE — Discharge Instructions (Signed)
It was a pleasure taking care of you during this hospitalization!  You came into the hospital because you were having cough, shortness of breath, and weakness. We found that you have a pneumonia. Your initial chest x-ray looked questionable, but we ordered a blood test which confirmed a pneumonia. We treated you with IV antibiotics. I have prescribed an antibiotic called Levaquin. Please take 1 tablet tomorrow (8/10) and 1 tablet on Monday (8/12).  Your kidneys showed signs of injury. They got better with fluids. You said that you do not want dialysis. We set you up with home hospice.  -Dr. Brett Albino

## 2017-12-10 NOTE — Care Management Obs Status (Signed)
[place] NOTIFICATION   Patient Details  Name: Kathryn Cain MRN: [id number] Date of Birth: [birth year]   Medicare Observation Status Notification Given:  Yes    Shelbie Ammons, RN 12/10/2017, 8:49 AM

## 2017-12-12 ENCOUNTER — Encounter: Payer: Self-pay | Admitting: *Deleted

## 2017-12-12 ENCOUNTER — Emergency Department
Admission: EM | Admit: 2017-12-12 | Discharge: 2017-12-12 | Disposition: A | Discharge status: Home / Self Care | Payer: Medicare Other | Attending: Emergency Medicine | Admitting: Emergency Medicine

## 2017-12-12 ENCOUNTER — Emergency Department: Payer: Medicare Other

## 2017-12-12 ENCOUNTER — Other Ambulatory Visit: Payer: Self-pay

## 2017-12-12 DIAGNOSIS — I2510 Atherosclerotic heart disease of native coronary artery without angina pectoris: Secondary | ICD-10-CM | POA: Diagnosis not present

## 2017-12-12 DIAGNOSIS — F17210 Nicotine dependence, cigarettes, uncomplicated: Secondary | ICD-10-CM | POA: Diagnosis not present

## 2017-12-12 DIAGNOSIS — I120 Hypertensive chronic kidney disease with stage 5 chronic kidney disease or end stage renal disease: Secondary | ICD-10-CM | POA: Insufficient documentation

## 2017-12-12 DIAGNOSIS — N186 End stage renal disease: Secondary | ICD-10-CM | POA: Diagnosis not present

## 2017-12-12 DIAGNOSIS — R0602 Shortness of breath: Secondary | ICD-10-CM

## 2017-12-12 DIAGNOSIS — J449 Chronic obstructive pulmonary disease, unspecified: Secondary | ICD-10-CM | POA: Diagnosis not present

## 2017-12-12 DIAGNOSIS — Z79899 Other long term (current) drug therapy: Secondary | ICD-10-CM | POA: Insufficient documentation

## 2017-12-12 LAB — BASIC METABOLIC PANEL
Anion gap: 13 (ref 5–15)
BUN: 65 mg/dL — ABNORMAL HIGH (ref 8–23)
CHLORIDE: 110 mmol/L (ref 98–111)
CO2: 14 mmol/L — ABNORMAL LOW (ref 22–32)
Calcium: 8.4 mg/dL — ABNORMAL LOW (ref 8.9–10.3)
Creatinine, Ser: 6.04 mg/dL — ABNORMAL HIGH (ref 0.44–1.00)
GFR, EST AFRICAN AMERICAN: 7 mL/min — AB (ref >60)
GFR, EST NON AFRICAN AMERICAN: 6 mL/min — AB (ref >60)
Glucose, Bld: 106 mg/dL — ABNORMAL HIGH (ref 70–99)
POTASSIUM: 4.8 mmol/L (ref 3.5–5.1)
SODIUM: 137 mmol/L (ref 135–145)

## 2017-12-12 LAB — CBC WITH DIFFERENTIAL/PLATELET
BASOS ABS: 0 10*3/uL (ref 0–0.1)
Basophils Relative: 1 %
EOS ABS: 0.1 10*3/uL (ref 0–0.7)
EOS PCT: 1 %
HCT: 22 % — ABNORMAL LOW (ref 35.0–47.0)
Hemoglobin: 7.3 g/dL — ABNORMAL LOW (ref 12.0–16.0)
Lymphocytes Relative: 8 %
Lymphs Abs: 0.5 10*3/uL — ABNORMAL LOW (ref 1.0–3.6)
MCH: 31.3 pg (ref 26.0–34.0)
MCHC: 33 g/dL (ref 32.0–36.0)
MCV: 94.7 fL (ref 80.0–100.0)
Monocytes Absolute: 0.5 10*3/uL (ref 0.2–0.9)
Monocytes Relative: 7 %
Neutro Abs: 5.3 10*3/uL (ref 1.4–6.5)
Neutrophils Relative %: 83 %
PLATELETS: 191 10*3/uL (ref 150–440)
RBC: 2.32 MIL/uL — AB (ref 3.80–5.20)
RDW: 15.6 % — ABNORMAL HIGH (ref 11.5–14.5)
WBC: 6.4 10*3/uL (ref 3.6–11.0)

## 2017-12-12 MED ORDER — PROAIR HFA 108 (90 BASE) MCG/ACT IN AERS: 2.0000 | INHALATION_SPRAY | RESPIRATORY_TRACT | 2 refills | Status: AC | PRN

## 2017-12-12 MED ORDER — ONDANSETRON 4 MG PO TBDP
ORAL_TABLET | ORAL | Status: AC
  Filled 2017-12-12: qty 1

## 2017-12-12 MED ORDER — IPRATROPIUM-ALBUTEROL 0.5-2.5 (3) MG/3ML IN SOLN
3.0000 mL | Freq: Once | RESPIRATORY_TRACT | Status: AC
  Administered 2017-12-12: 3 mL via RESPIRATORY_TRACT
  Filled 2017-12-12: qty 3

## 2017-12-12 MED ORDER — ONDANSETRON 4 MG PO TBDP
4.0000 mg | ORAL_TABLET | Freq: Once | ORAL | Status: AC
  Administered 2017-12-12: 4 mg via ORAL

## 2017-12-12 NOTE — Discharge Instructions (Signed)
Continue to take the antibiotics as prescribed. Use your inhaler 2 puffs every 4-6 hours for shortness of breath. Follow up with your doctor in 2 days. Return to the ER for shortness of breath, chest pain, and fever.

## 2017-12-12 NOTE — ED Provider Notes (Signed)
[facility] Emergency Department Provider Note  ____________________________________________  Time seen: Approximately 7:00 PM  I have reviewed the triage vital signs and the nursing notes.   HISTORY  Chief Complaint Shortness of Breath  Level 5 caveat:  Portions of the history and physical were unable to be obtained due to senility   HPI Kathryn Cain is a 84 y.o. female with complex medical history as listed below who presents for evaluation of shortness of breath.  Patient was admitted to the hospital for 1 night 3 days ago for HCAP and she was discharged home on Levaquin.  Patient has been taking the medications as prescribed.  Today while being changed at home she started gasping for air and 911 was called.  Patient does not use oxygen at home.  She reports that she feels back to normal right now and is not complaining of worsening shortness of breath.  No fever, no vomiting, no chest pain.  Patient is supposed to be evaluated by hospice nurse this evening to be started on hospice care for end-stage renal disease since she does not want to be placed on dialysis.  Patient does have a history of COPD and has not used any of her inhalers for this episode earlier today.  Past Medical History:  Diagnosis Date  . A-fib ([place])   . Anginal pain ([place])   . Arthritis   . Cancer (HCC)    BREAST  . Chronic renal insufficiency   . COPD (chronic obstructive pulmonary disease) ([place])   . Coronary artery disease   . Depression   . Diverticulosis   . Gout   . HOH (hard of hearing)   . Hyperlipidemia   . Hypertension   . Myocardial infarction ([place])   . Nephrolithiasis   . Peripheral vascular disease ([place])   . Stroke [facility]    2013    Patient Active Problem List   Diagnosis Date Noted  . Acute-on-chronic kidney injury ([place]) 12/09/2017  . Acute kidney injury superimposed on chronic kidney disease ([place]) 11/25/2017  . Benign neoplasm of descending colon   .  Gastritis without bleeding   . Duodenitis   . GI bleed 07/15/2017  . Iron deficiency anemia     Past Surgical History:  Procedure Laterality Date  . AMPUTATION Right 04/02/2016   Procedure: AMPUTATION FINGER TIP;  Surgeon: Hessie Knows, MD;  Location: [facility] ORS;  Service: Orthopedics;  Laterality: Right;  . APPENDECTOMY    . BREAST SURGERY Right   . CARDIAC SURGERY    . CATARACT EXTRACTION W/PHACO Right 07/08/2017   Procedure: CATARACT EXTRACTION PHACO AND INTRAOCULAR LENS PLACEMENT (IOC);  Surgeon: Eulogio Bear, MD;  Location: [facility] ORS;  Service: Ophthalmology;  Laterality: Right;  fluid lot #[id number]  exp 02/01/2019 [place]   00:32.2 AP%   12.7 CDE  4.08   . COLONOSCOPY WITH PROPOFOL N/A 07/16/2017   Procedure: COLONOSCOPY WITH PROPOFOL;  Surgeon: [name], MD;  Location: [facility] ENDOSCOPY;  Service: Endoscopy;  Laterality: N/A;  . CORONARY ARTERY BYPASS GRAFT     2013  . DISTAL INTERPHALANGEAL JOINT FUSION Right 11/28/2015   Procedure: DISTAL INTERPHALANGEAL JOINT FUSION;  Surgeon: Hessie Knows, MD;  Location: [facility] ORS;  Service: Orthopedics;  Laterality: Right;  third finger  . ESOPHAGOGASTRODUODENOSCOPY (EGD) WITH PROPOFOL N/A 07/16/2017   Procedure: ESOPHAGOGASTRODUODENOSCOPY (EGD) WITH PROPOFOL;  Surgeon: [name], MD;  Location: [facility] ENDOSCOPY;  Service: Endoscopy;  Laterality: N/A;  . KIDNEY STONE SURGERY    .  MASTECTOMY Right    1986    Prior to Admission medications   Medication Sig Start Date End Date Taking? Authorizing Provider  amLODipine (NORVASC) 5 MG tablet Take 2 tablets (10 mg total) by mouth daily. 11/28/17 12/28/17 Yes Pyreddy, Reatha Harps, MD  citalopram (CELEXA) 20 MG tablet Take 20 mg by mouth daily.   Yes [provider]  hydrALAZINE (APRESOLINE) 25 MG tablet Take 1 tablet (25 mg total) by mouth 3 (three) times daily. 11/28/17 12/28/17 Yes Pyreddy, Reatha Harps, MD  Iron-Vitamins (GERITOL PO) Take 1 tablet by mouth daily.    Yes [provider]    levofloxacin (LEVAQUIN) 500 MG tablet Take 1 tablet (500 mg total) by mouth every other day for 4 days. 12/10/17 12/14/17 Yes Mayo, Pete Pelt, MD  levothyroxine (SYNTHROID, LEVOTHROID) 25 MCG tablet Take 25 mcg by mouth daily.  10/05/17  Yes [provider]  metoprolol tartrate (LOPRESSOR) 25 MG tablet Take 1 tablet (25 mg total) by mouth 2 (two) times daily. 11/28/17 12/28/17 Yes Pyreddy, Reatha Harps, MD  Omega-3 Fatty Acids (FISH OIL) 1000 MG CAPS Take 1,000 mg by mouth daily.    Yes [provider]  pravastatin (PRAVACHOL) 80 MG tablet Take 80 mg by mouth at bedtime.    Yes [provider]  traMADol (ULTRAM) 50 MG tablet Take by mouth every 8 (eight) hours as needed.   Yes [provider]  traZODone (DESYREL) 50 MG tablet Take 50 mg by mouth at bedtime.   Yes [provider]  diphenhydramine-acetaminophen (TYLENOL PM) 25-500 MG TABS tablet Take 1 tablet by mouth at bedtime as needed (for sleep).     [provider]  pantoprazole (PROTONIX) 40 MG tablet Take 1 tablet (40 mg total) by mouth daily. 07/17/17   Fritzi Mandes, MD  PROAIR HFA 108 [phone] Base) MCG/ACT inhaler Inhale 2 puffs into the lungs every 4 (four) hours as needed for wheezing or shortness of breath. 12/12/17   Rudene Re, MD    Allergies Amiodarone; Ciprofloxacin hcl; Codeine; and Eliquis [apixaban]  History reviewed. No pertinent family history.  Social History Social History   Tobacco Use  . Smoking status: Current Some Day Smoker    Packs/day: 0.50    Types: Cigarettes  . Smokeless tobacco: Never Used  Substance Use Topics  . Alcohol use: No  . Drug use: No    Review of Systems  Constitutional: Negative for fever. Eyes: Negative for visual changes. ENT: Negative for sore throat. Neck: No neck pain  Cardiovascular: Negative for chest pain. Respiratory: + shortness of breath. Gastrointestinal: Negative for abdominal pain, vomiting or diarrhea. Genitourinary:  Negative for dysuria. Musculoskeletal: Negative for back pain. Skin: Negative for rash. Neurological: Negative for headaches, weakness or numbness. Psych: No SI or HI  ____________________________________________   PHYSICAL EXAM:  VITAL SIGNS: ED Triage Vitals  Enc Vitals Group     BP 12/12/17 1835 (!) 132/92     Pulse Rate 12/12/17 1835 96     Resp 12/12/17 1835 18     Temp 12/12/17 1835 97.8 F (36.6 C)     Temp Source 12/12/17 1835 Oral     SpO2 12/12/17 1825 99 %     Weight 12/12/17 1838 132 lb 15 oz (60.3 kg)     Height 12/12/17 1838 5\' 3"  (1.6 m)     Head Circumference --      Peak Flow --      Pain Score 12/12/17 1836 0     Pain Loc --  Pain Edu? --      Excl. in [place]? --     Constitutional: Alert and oriented. Well appearing and in no apparent distress. HEENT:      Head: Normocephalic and atraumatic.         Eyes: Conjunctivae are normal. Sclera is non-icteric.       Mouth/Throat: Mucous membranes are moist.       Neck: Supple with no signs of meningismus. Cardiovascular: Regular rate and rhythm. No murmurs, gallops, or rubs. 2+ symmetrical distal pulses are present in all extremities. No JVD. Respiratory: Normal respiratory effort.  Normal sats on room air, decreased air movement bilaterally. No wheezes, crackles, or rhonchi.  Gastrointestinal: Soft, non tender, and non distended with positive bowel sounds. No rebound or guarding. Musculoskeletal: Nontender with normal range of motion in all extremities. No edema, cyanosis, or erythema of extremities. Neurologic: Normal speech and language. Face is symmetric. Moving all extremities. No gross focal neurologic deficits are appreciated. Skin: Skin is warm, dry and intact. No rash noted. Psychiatric: Mood and affect are normal. Speech and behavior are normal.  ____________________________________________   LABS (all labs ordered are listed, but only abnormal results are displayed)  Labs Reviewed  CBC WITH  DIFFERENTIAL/PLATELET - Abnormal; Notable for the following components:      Result Value   RBC 2.32 (*)    Hemoglobin 7.3 (*)    HCT 22.0 (*)    RDW 15.6 (*)    Lymphs Abs 0.5 (*)    All other components within normal limits  BASIC METABOLIC PANEL - Abnormal; Notable for the following components:   CO2 14 (*)    Glucose, Bld 106 (*)    BUN 65 (*)    Creatinine, Ser 6.04 (*)    Calcium 8.4 (*)    GFR calc non Af Amer 6 (*)    GFR calc Af Amer 7 (*)    All other components within normal limits   ____________________________________________  EKG  ED ECG REPORT I, Rudene Re, the attending physician, personally viewed and interpreted this ECG.   Sinus tachycardia, rate of 100, right bundle branch block, borderline prolonged QTC, normal axis, no ST elevations or depressions, T wave inversions in V2 and V3.  Unchanged from prior ____________________________________________  RADIOLOGY  I have personally reviewed the images performed during this visit and I agree with the Radiologist's read.   Interpretation by Radiologist:  Dg Chest 2 View  Result Date: 12/12/2017 CLINICAL DATA:  Shortness of breath EXAM: CHEST - 2 VIEW COMPARISON:  December 09, 2017 FINDINGS: The heart size and mediastinal contours are stable. The aorta is tortuous. The heart size is mildly enlarged. There are small bilateral pleural effusions. Mild increased pulmonary interstitium is identified bilaterally. No focal pneumonia is noted. The visualized skeletal structures are unremarkable. IMPRESSION: Mild interstitial pulmonary edema. Small bilateral pleural effusions. Electronically Signed   By: Abelardo Diesel M.D.   On: 12/12/2017 19:32     ____________________________________________   PROCEDURES  Procedure(s) performed: None Procedures Critical Care performed:  None ____________________________________________   INITIAL IMPRESSION / ASSESSMENT AND PLAN / ED COURSE   84 y.o. female with complex  medical history as listed below who presents for evaluation of shortness of breath.  Patient with a short-lived episode of gasping for air while being changed to today.  She is currently on Levaquin for HCAP.  Hospice nurse supposed to meet with family this evening to initiate hospice care this coming week for ESRD since patient  is refusing dialysis.  At this time she has normal work of breathing, she is satting 96 to 100% on room air, she does have diminished air movement bilaterally with no wheezes or crackles.  Due to her history of COPD I will provide patient with 2 duo nebs.  I am going to recheck a chest x-ray to make sure the patient's pneumonia is not getting worse.  Clinical Course as of Dec 13 2038  Nancy Fetter Dec 12, 2017  2011 Patient remains with no oxygen requirement.  Labs showed no change from baseline.  Chest x-ray shows no worsening pneumonia. Spoke with Horris Latino, nurse from hospice who will provide patient with oxygen for comfort. Patient to start hospice care tomorrow. She received 2 duonebs here, recommended doing 2 puffs every 4 hours until abx course is done to help with breathing. At this time family is comfortable taking her home. Recommended return to the ER for worsening SOB, CP, fever.   [CV]    Clinical Course User Index [CV] Alfred Levins [place], MD     As part of my medical decision making, I reviewed the following data within the [place] History obtained from family, Nursing notes reviewed and incorporated, Labs reviewed , EKG interpreted , Old EKG reviewed, Old chart reviewed, Radiograph reviewed , Notes from prior ED visits and [place] Controlled Substance Database    Pertinent labs & imaging results that were available during my care of the patient were reviewed by me and considered in my medical decision making (see chart for details).    ____________________________________________   FINAL CLINICAL IMPRESSION(S) / ED DIAGNOSES  Final diagnoses:  SOB  (shortness of breath)      NEW MEDICATIONS STARTED DURING THIS VISIT:  ED Discharge Orders         Ordered    PROAIR HFA 108 (90 Base) MCG/ACT inhaler  Every 4 hours PRN     12/12/17 2014           Note:  This document was prepared using Dragon voice recognition software and may include unintentional dictation errors.    Alfred Levins, [place], MD 12/12/17 2040

## 2017-12-12 NOTE — ED Triage Notes (Signed)
Pt presents w/ reported c/o shortness of breath. Pt hospitalized 3 days ago. Pt has ESRD and is not on dialysis. Family is meeting with hospice this evening, per EMS report. Pt has no O2 @ home. Fire dept measured SaO2 @ 91% on room air, placed on 4L/ O2/[place] w/ SaO2 increase to 95%. EMS placed pt on 6L/O2/[place], with SaO2 = 99%. Pt in no acute respiratory distress at this time. EMS report lung sounds clear, shallow breaths, no crackles, rhonchi, or wet lung sounds.

## 2017-12-14 LAB — CULTURE, BLOOD (ROUTINE X 2)
CULTURE: NO GROWTH
Special Requests: ADEQUATE

## 2017-12-16 LAB — CULTURE, BLOOD (ROUTINE X 2)

## 2018-04-03 DEATH — deceased

## 2018-10-24 IMAGING — CR DG CHEST 2V
2 series · 2 of 2 positions shown · non-contrast
Comparison: Chest radiograph performed 08/06/2010

CLINICAL DATA: Acute onset of intermittent generalized chest pain.
Initial encounter.

EXAM:
CHEST  2 VIEW

[chest lat]
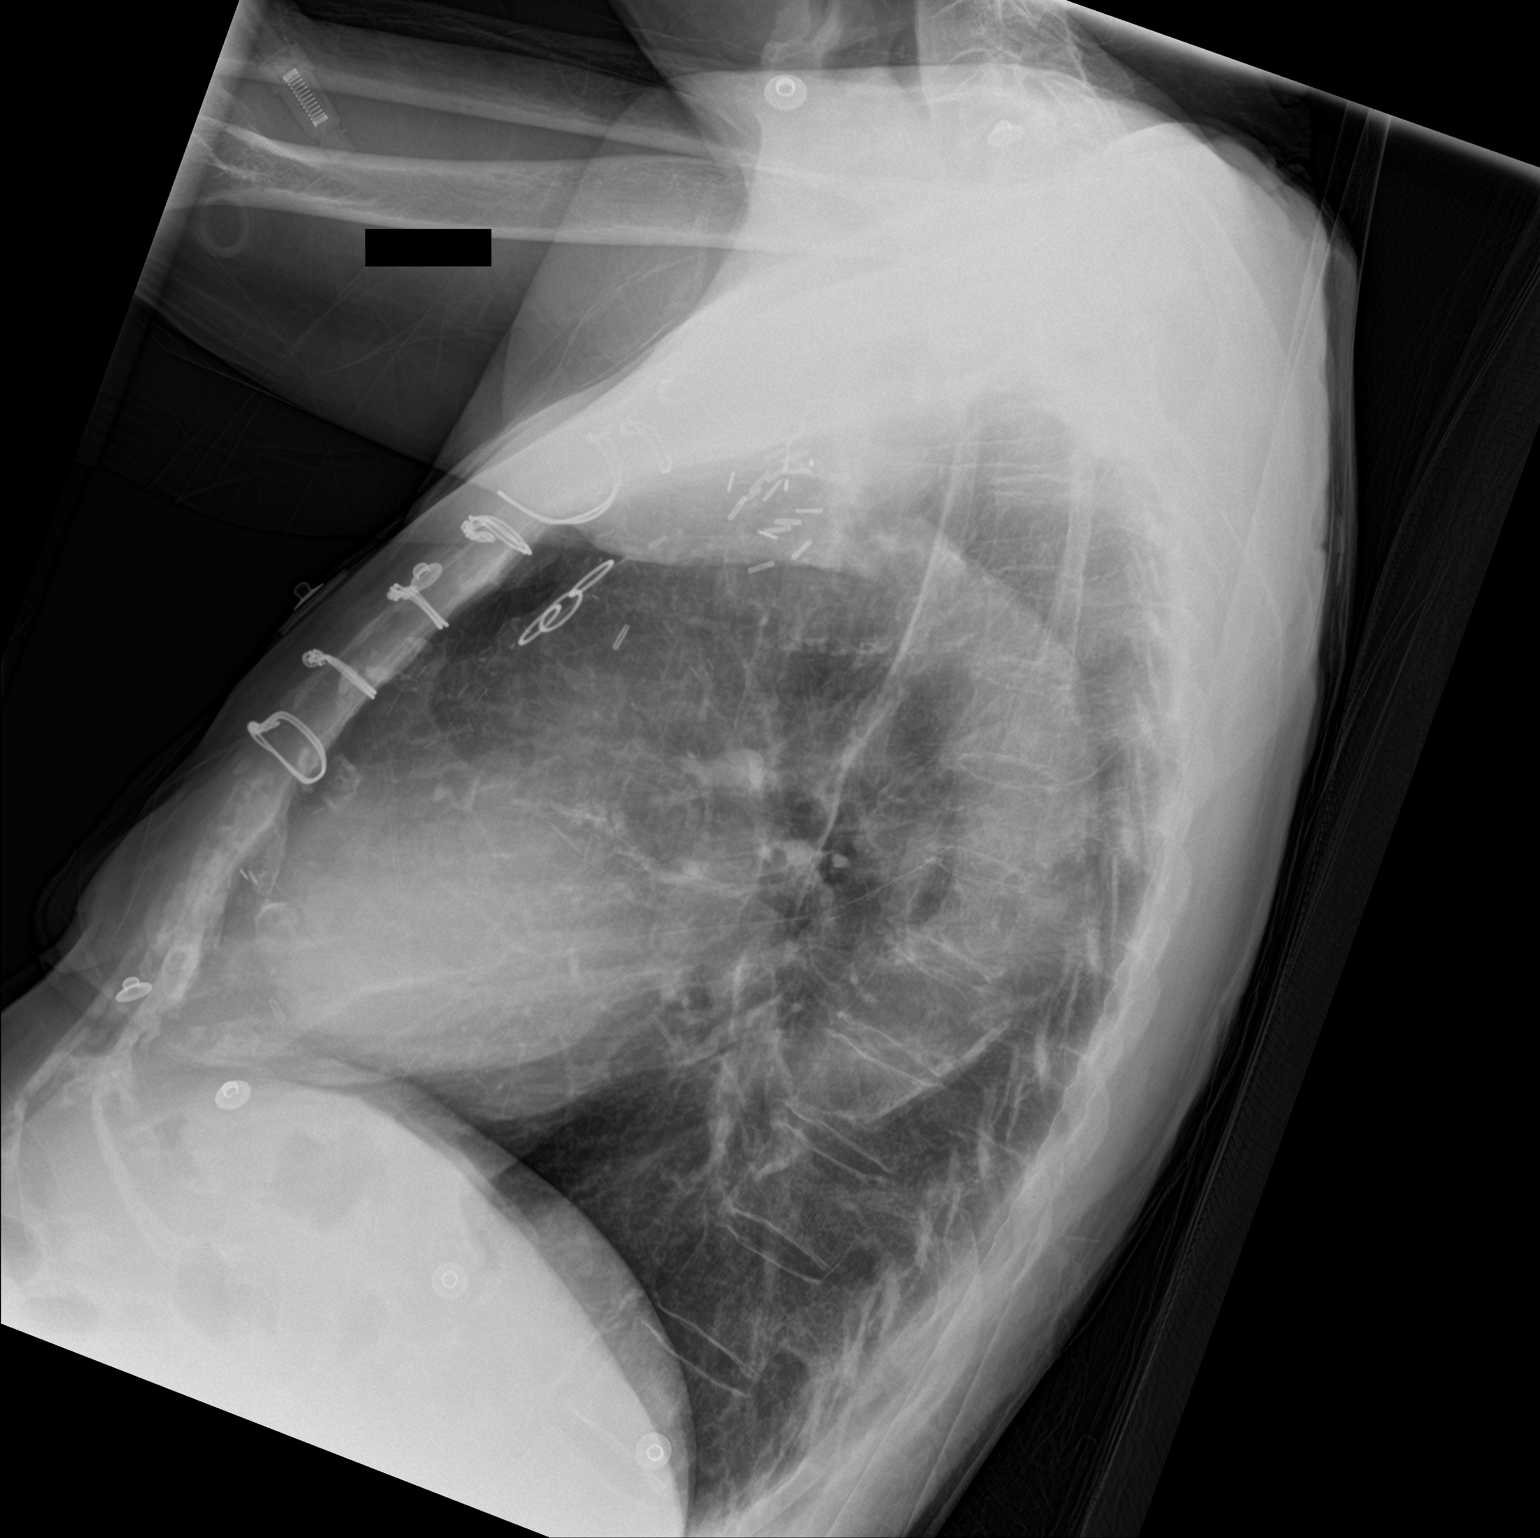

[chest ap]
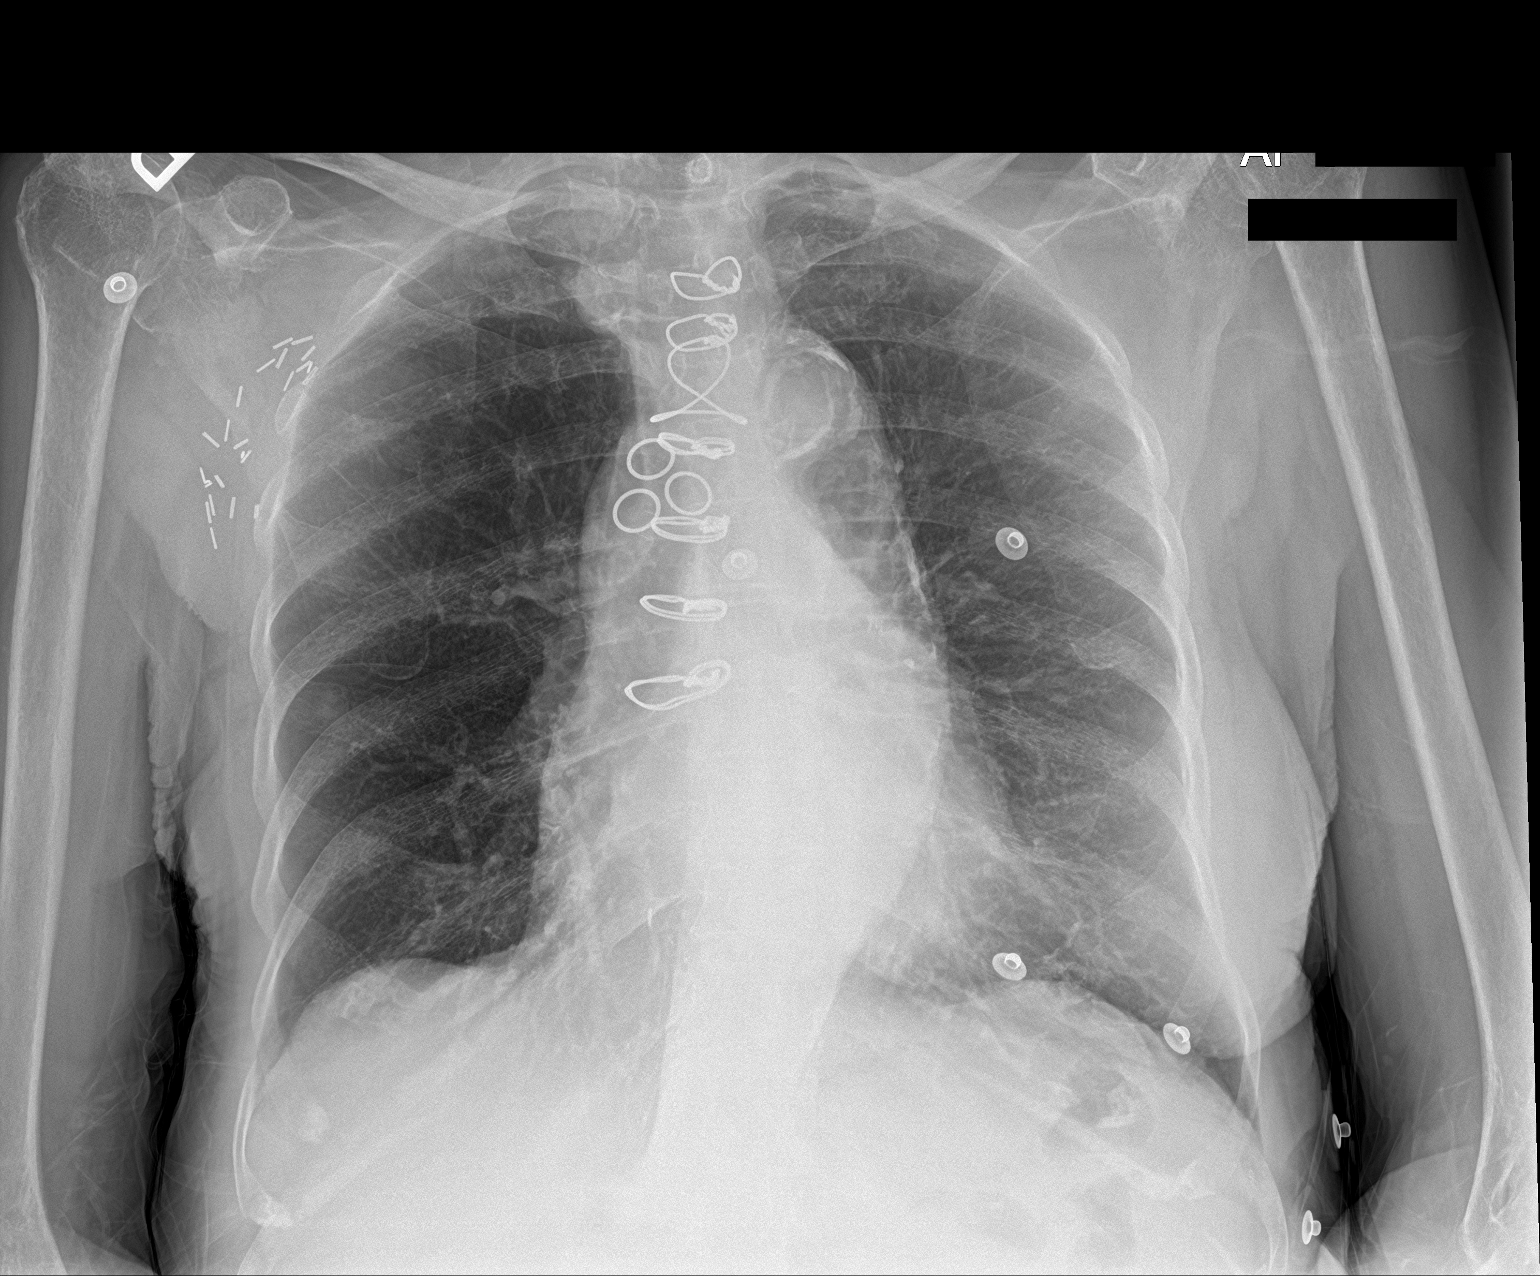

[2 of 2 positions shown; findings below may reference images not displayed]

FINDINGS: The lungs are well-aerated and clear. There is no evidence of focal
opacification, pleural effusion or pneumothorax.

The heart is normal in size; the patient is status post median
sternotomy, with evidence of prior CABG. No acute osseous
abnormalities are seen. Clips are seen overlying the right axilla.
There is chronic superior subluxation of both humeral heads.
IMPRESSION: No acute cardiopulmonary process seen.

## 2019-03-23 IMAGING — US US RENAL
1 series · 14 of 25 positions shown · non-contrast
Comparison: CT abdomen and pelvis 03/16/2014

CLINICAL DATA: Acute renal failure.

EXAM:
RENAL / URINARY TRACT ULTRASOUND COMPLETE

[Series 1: us renal · 0.23mm/px · 14 of 37 slices shown]
[im 1/37]
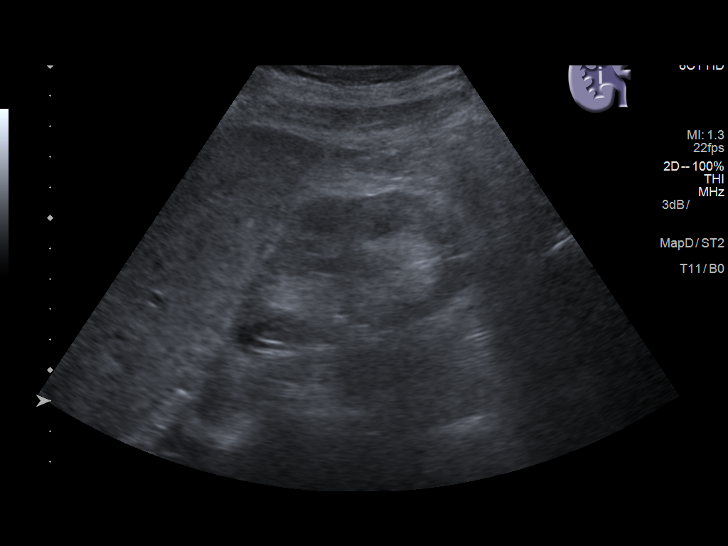
[im 4/37]
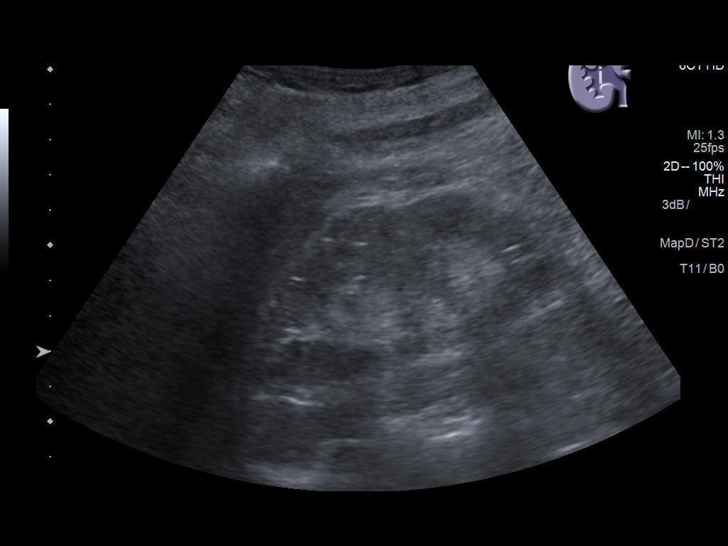
[im 7/37]
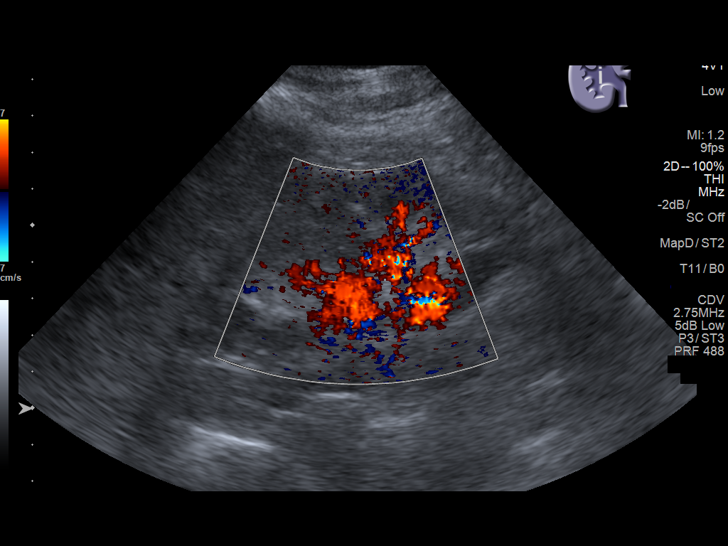
[im 10/37]
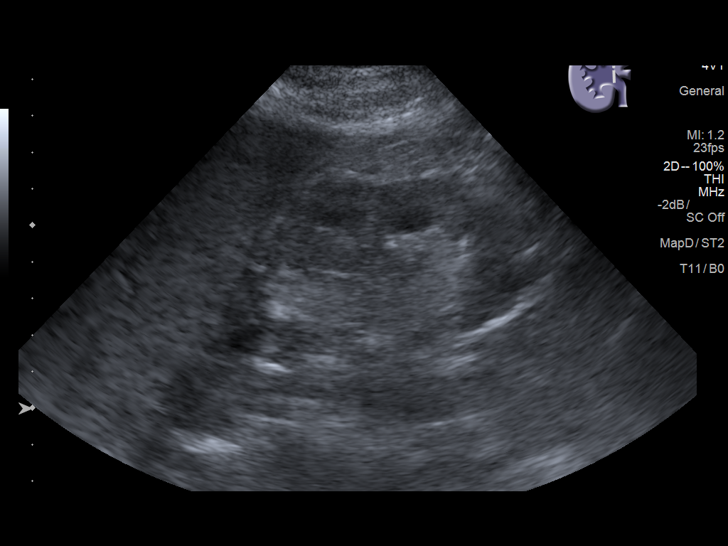
[im 13/37]
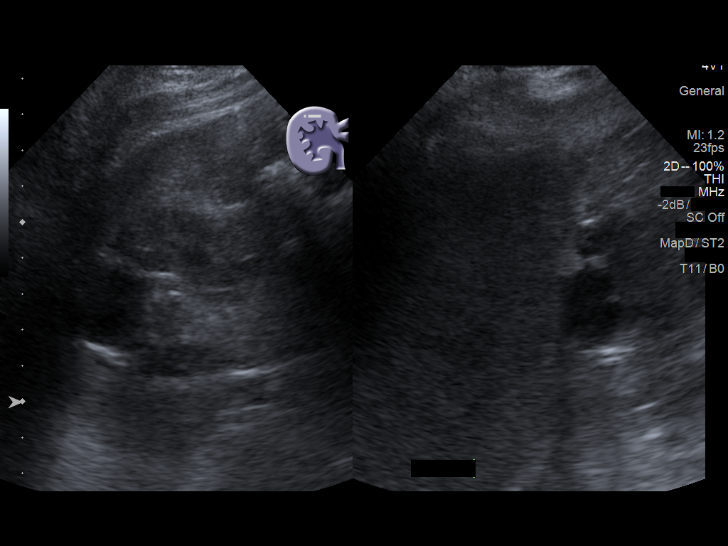
[im 14/37]
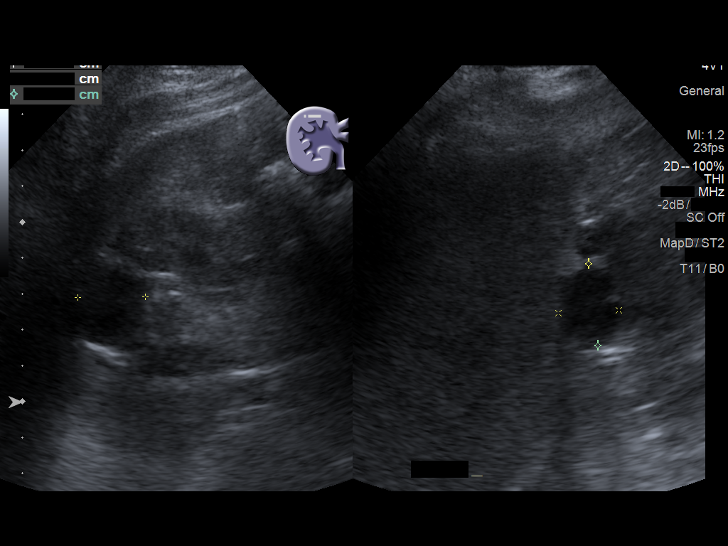
[im 17/37]
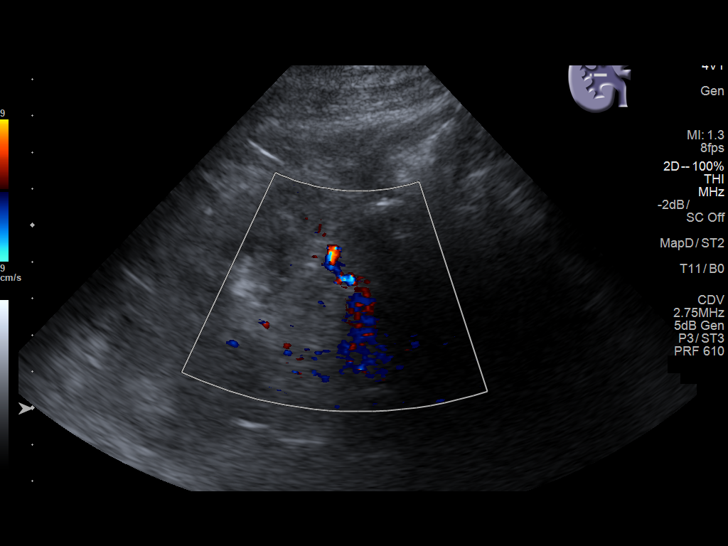
[im 20/37]
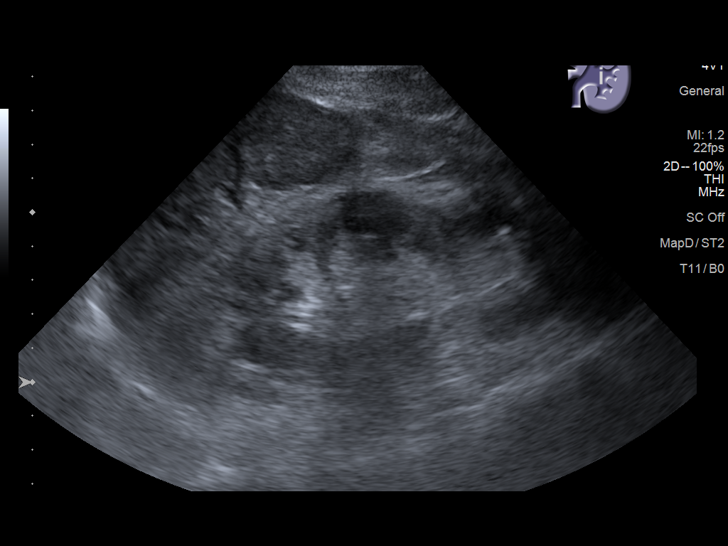
[im 23/37]
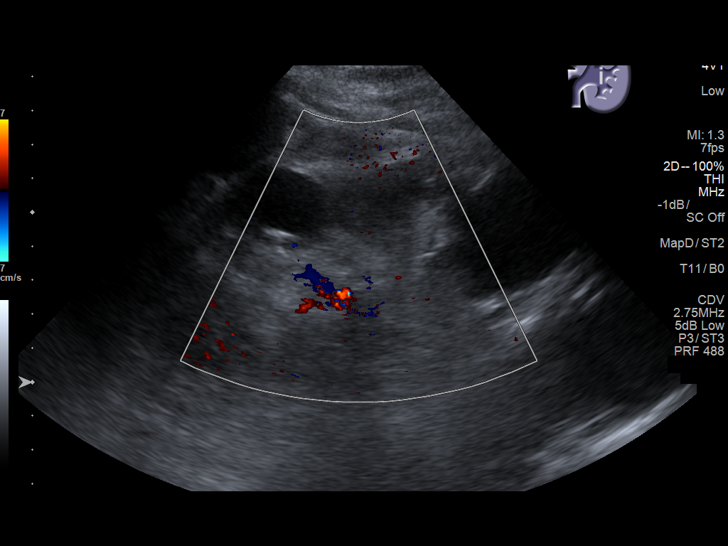
[im 25/37]
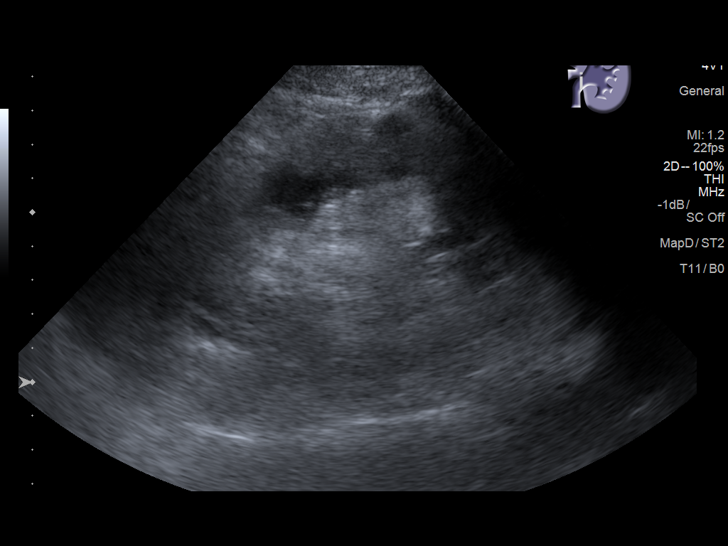
[im 28/37]
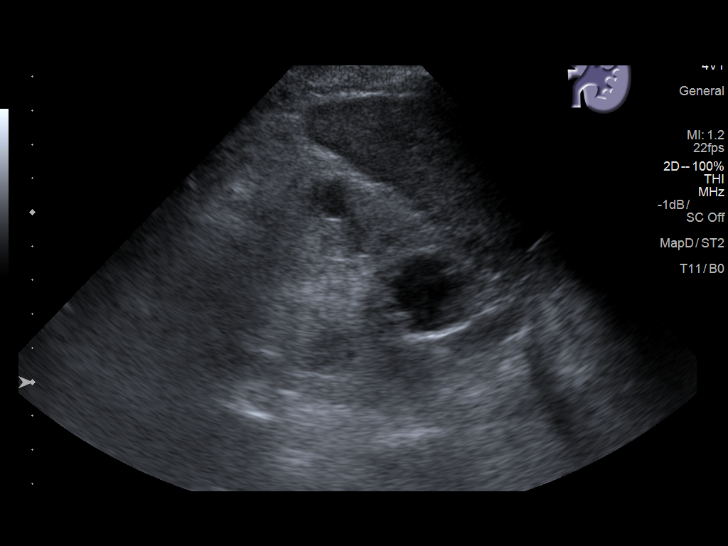
[im 31/37]
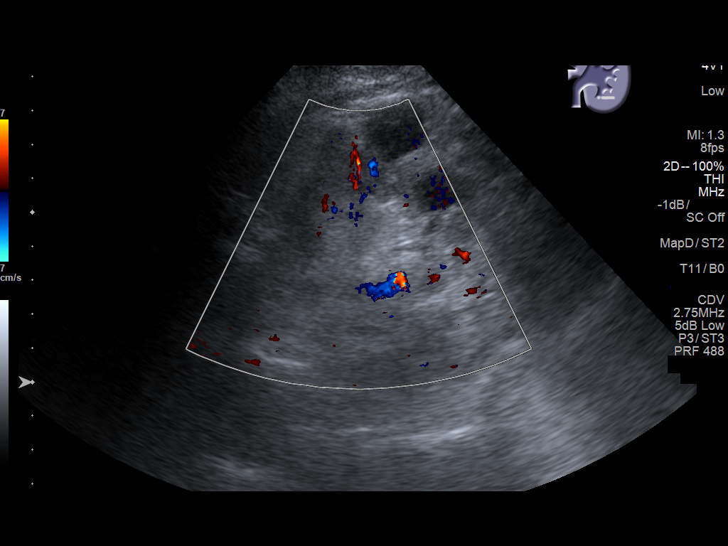
[im 34/37]
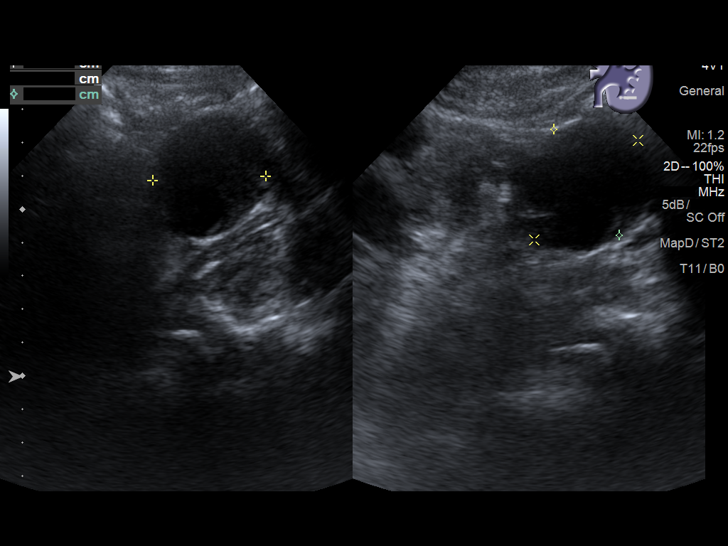
[im 37/37]
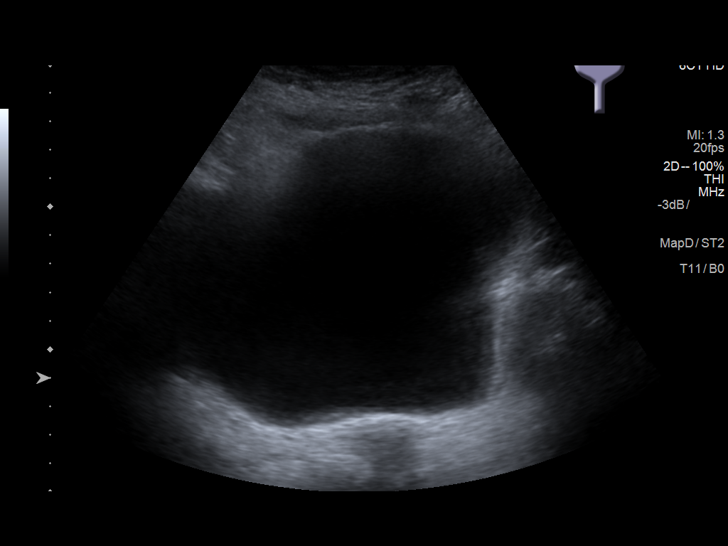

[14 of 25 positions shown; findings below may reference images not displayed]

FINDINGS: Right Kidney:

Length: 9.8 cm. Mildly increased parenchymal echogenicity. No
hydronephrosis. Multiple cysts, the largest measuring 2.3 cm.

Left Kidney:

Length: 10.5 cm. Mildly increased parenchymal echogenicity. No
hydronephrosis. Multiple cysts, the largest measuring 4.3 cm.

Bladder:

Appears normal for degree of bladder distention.
IMPRESSION: 1. Evidence of medical renal disease.  No hydronephrosis.
2. Bilateral renal cysts.

## 2019-12-06 IMAGING — CR DG ELBOW 2V*L*
2 series · 2 of 2 positions shown · non-contrast
Comparison: None.

CLINICAL DATA: Left elbow pain after fall.

EXAM:
LEFT ELBOW - 2 VIEW

[elbow ap]
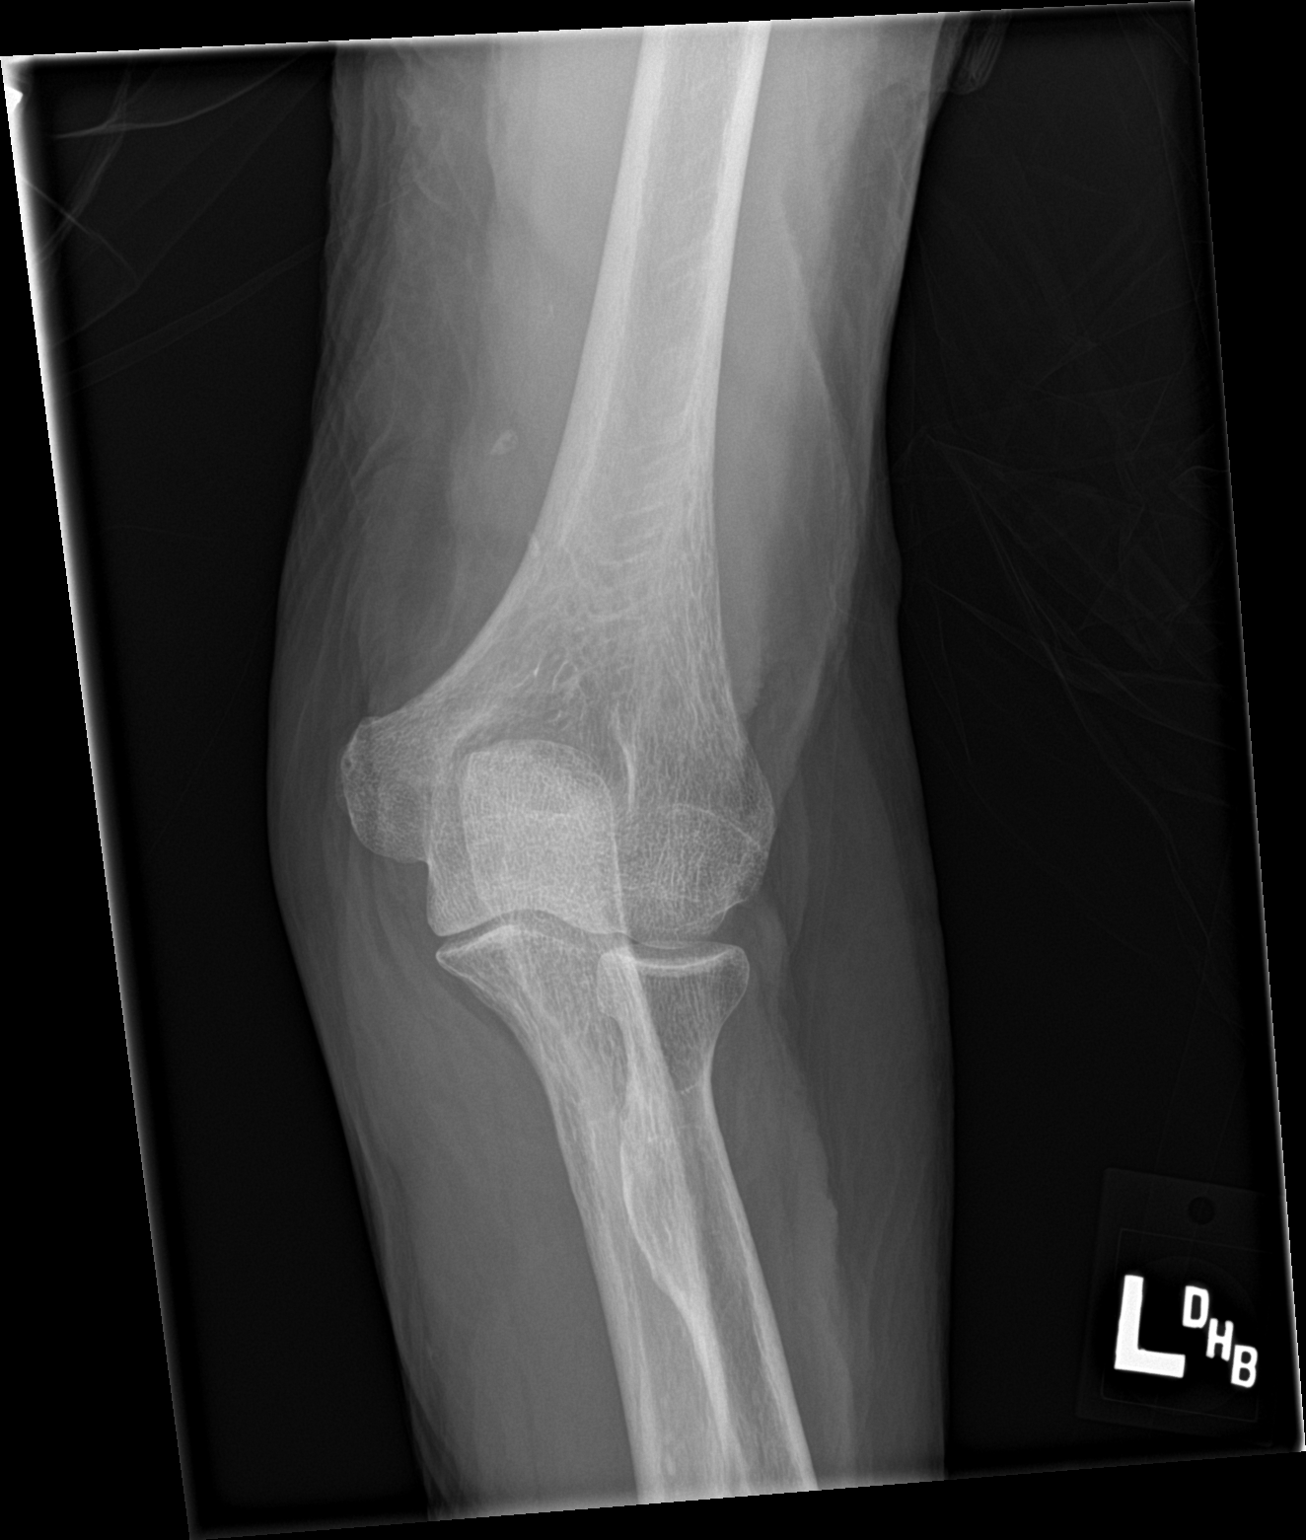

[elbow lat]
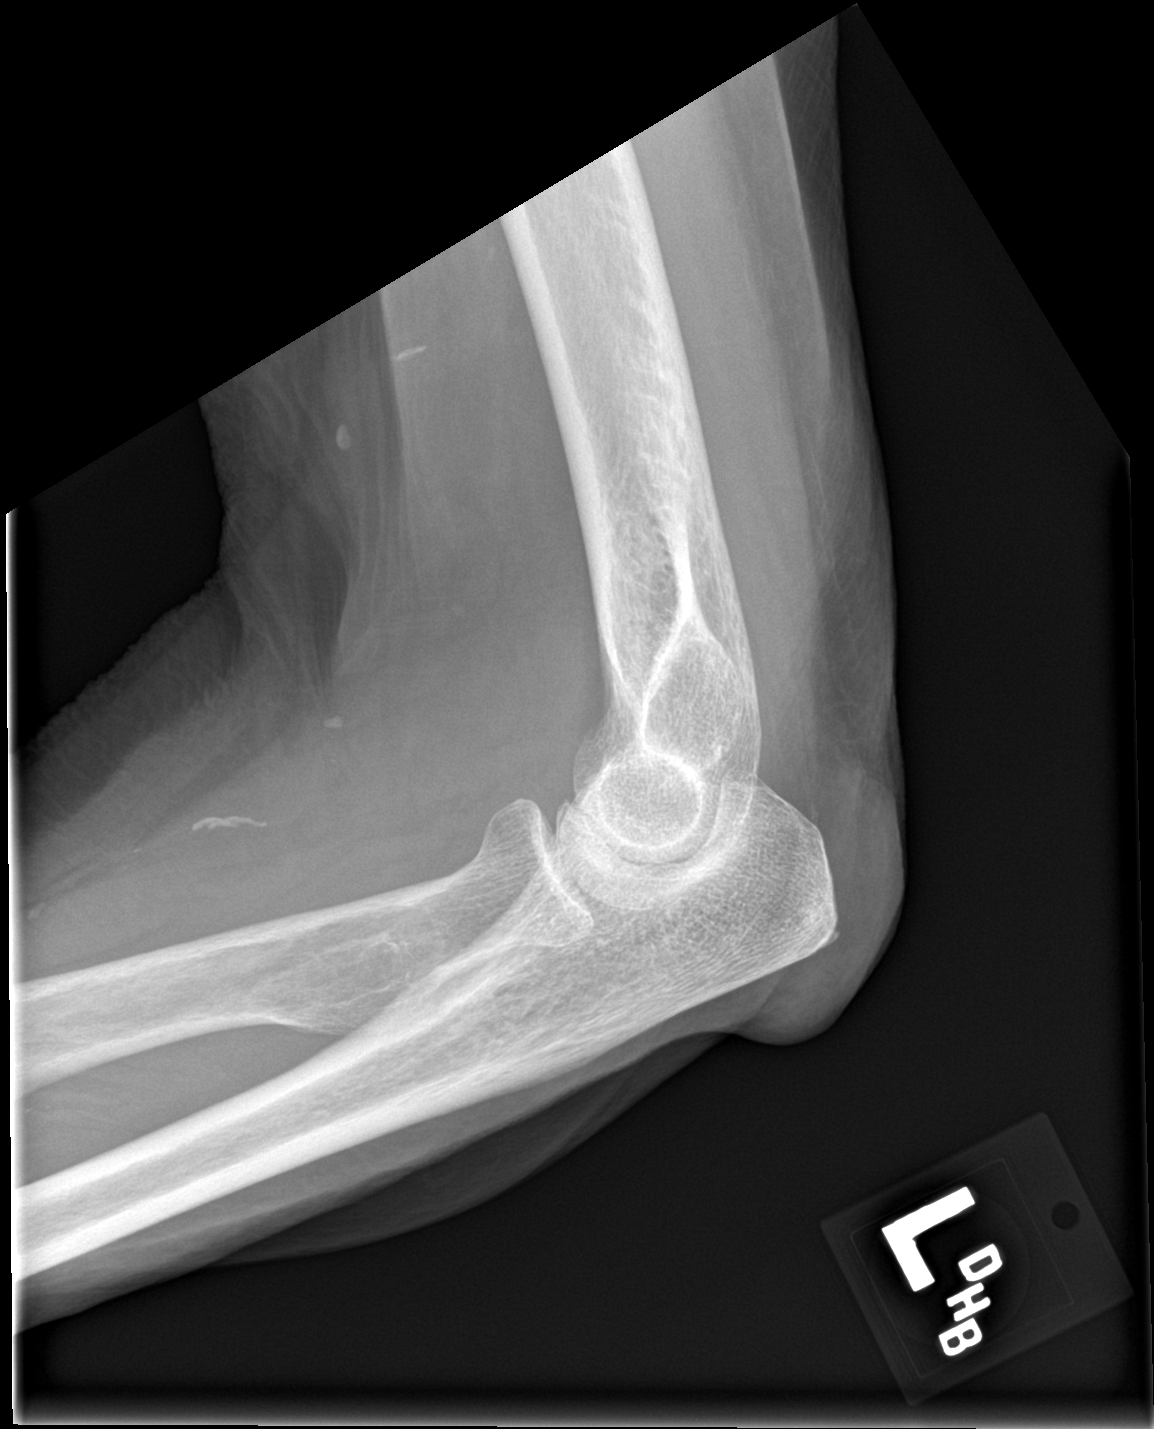

[2 of 2 positions shown; findings below may reference images not displayed]

FINDINGS: There is no evidence of fracture, dislocation, or joint effusion.
There is no evidence of arthropathy or other focal bone abnormality.
Soft tissues are unremarkable.
IMPRESSION: Normal left elbow.
